# Patient Record
Sex: Male | Born: 1950 | Race: White | Hispanic: No | Marital: Married | Smoking: Former smoker
Health system: Southern US, Community
[De-identification: ages and names within clinical notes are randomized; demographics above are authoritative.]

## PROBLEM LIST (undated history)

## (undated) DIAGNOSIS — E785 Hyperlipidemia, unspecified: Secondary | ICD-10-CM

## (undated) DIAGNOSIS — E119 Type 2 diabetes mellitus without complications: Secondary | ICD-10-CM

## (undated) DIAGNOSIS — I10 Essential (primary) hypertension: Secondary | ICD-10-CM

## (undated) DIAGNOSIS — E669 Obesity, unspecified: Secondary | ICD-10-CM

## (undated) DIAGNOSIS — I2510 Atherosclerotic heart disease of native coronary artery without angina pectoris: Secondary | ICD-10-CM

## (undated) HISTORY — PX: OTHER SURGICAL HISTORY: SHX169

## (undated) HISTORY — PX: HAND SURGERY: SHX662

---

## 2012-12-28 ENCOUNTER — Encounter (HOSPITAL_COMMUNITY): Payer: Self-pay | Admitting: Physician Assistant

## 2012-12-28 ENCOUNTER — Inpatient Hospital Stay (HOSPITAL_COMMUNITY): Payer: Non-veteran care

## 2012-12-28 ENCOUNTER — Inpatient Hospital Stay (HOSPITAL_COMMUNITY)
Admission: EM | Admit: 2012-12-28 | Discharge: 2012-12-31 | DRG: 246 | Disposition: A | Discharge status: Home / Self Care | Payer: Non-veteran care | Source: Ambulatory Visit | Attending: Cardiovascular Disease | Admitting: Cardiovascular Disease

## 2012-12-28 ENCOUNTER — Encounter (HOSPITAL_COMMUNITY)
Admission: EM | Disposition: A | Discharge status: Home / Self Care | Payer: Self-pay | Source: Ambulatory Visit | Attending: Cardiovascular Disease

## 2012-12-28 DIAGNOSIS — E669 Obesity, unspecified: Secondary | ICD-10-CM

## 2012-12-28 DIAGNOSIS — E785 Hyperlipidemia, unspecified: Secondary | ICD-10-CM

## 2012-12-28 DIAGNOSIS — T46905A Adverse effect of unspecified agents primarily affecting the cardiovascular system, initial encounter: Secondary | ICD-10-CM | POA: Diagnosis present

## 2012-12-28 DIAGNOSIS — I2109 ST elevation (STEMI) myocardial infarction involving other coronary artery of anterior wall: Secondary | ICD-10-CM

## 2012-12-28 DIAGNOSIS — K7200 Acute and subacute hepatic failure without coma: Secondary | ICD-10-CM | POA: Diagnosis present

## 2012-12-28 DIAGNOSIS — I213 ST elevation (STEMI) myocardial infarction of unspecified site: Secondary | ICD-10-CM

## 2012-12-28 DIAGNOSIS — R0602 Shortness of breath: Secondary | ICD-10-CM | POA: Diagnosis present

## 2012-12-28 DIAGNOSIS — E876 Hypokalemia: Secondary | ICD-10-CM

## 2012-12-28 DIAGNOSIS — I10 Essential (primary) hypertension: Secondary | ICD-10-CM | POA: Diagnosis present

## 2012-12-28 DIAGNOSIS — D72829 Elevated white blood cell count, unspecified: Secondary | ICD-10-CM

## 2012-12-28 DIAGNOSIS — I2510 Atherosclerotic heart disease of native coronary artery without angina pectoris: Secondary | ICD-10-CM

## 2012-12-28 DIAGNOSIS — E119 Type 2 diabetes mellitus without complications: Secondary | ICD-10-CM

## 2012-12-28 DIAGNOSIS — I2119 ST elevation (STEMI) myocardial infarction involving other coronary artery of inferior wall: Principal | ICD-10-CM | POA: Diagnosis present

## 2012-12-28 DIAGNOSIS — R7989 Other specified abnormal findings of blood chemistry: Secondary | ICD-10-CM

## 2012-12-28 DIAGNOSIS — D649 Anemia, unspecified: Secondary | ICD-10-CM

## 2012-12-28 HISTORY — DX: Obesity, unspecified: E66.9

## 2012-12-28 HISTORY — PX: LEFT HEART CATHETERIZATION WITH CORONARY ANGIOGRAM: SHX5451

## 2012-12-28 HISTORY — DX: Essential (primary) hypertension: I10

## 2012-12-28 HISTORY — DX: Hyperlipidemia, unspecified: E78.5

## 2012-12-28 HISTORY — DX: Type 2 diabetes mellitus without complications: E11.9

## 2012-12-28 HISTORY — PX: CORONARY ANGIOPLASTY WITH STENT PLACEMENT: SHX49

## 2012-12-28 HISTORY — DX: Atherosclerotic heart disease of native coronary artery without angina pectoris: I25.10

## 2012-12-28 LAB — COMPREHENSIVE METABOLIC PANEL
ALT: 40 U/L (ref 0–53)
Alkaline Phosphatase: 82 U/L (ref 39–117)
BUN: 21 mg/dL (ref 6–23)
CO2: 21 mEq/L (ref 19–32)
GFR calc Af Amer: 69 mL/min — ABNORMAL LOW (ref >90)
GFR calc non Af Amer: 59 mL/min — ABNORMAL LOW (ref >90)
Glucose, Bld: 201 mg/dL — ABNORMAL HIGH (ref 70–99)
Potassium: 3.4 mEq/L — ABNORMAL LOW (ref 3.5–5.1)
Sodium: 135 mEq/L (ref 135–145)
Total Bilirubin: 0.2 mg/dL — ABNORMAL LOW (ref 0.3–1.2)

## 2012-12-28 LAB — POCT I-STAT, CHEM 8
BUN: 29 mg/dL — ABNORMAL HIGH (ref 6–23)
Calcium, Ion: 1.1 mmol/L — ABNORMAL LOW (ref 1.13–1.30)
HCT: 44 % (ref 39.0–52.0)
Hemoglobin: 15 g/dL (ref 13.0–17.0)
Potassium: 4.2 mEq/L (ref 3.5–5.1)
TCO2: 26 mmol/L (ref 0–100)

## 2012-12-28 LAB — LIPID PANEL
LDL Cholesterol: 102 mg/dL — ABNORMAL HIGH (ref 0–99)
Total CHOL/HDL Ratio: 6.7 RATIO
Triglycerides: 207 mg/dL — ABNORMAL HIGH (ref <150)
VLDL: 41 mg/dL — ABNORMAL HIGH (ref 0–40)

## 2012-12-28 LAB — TROPONIN I
Troponin I: >20 ng/mL (ref <0.30)
Troponin I: >20 ng/mL (ref <0.30)

## 2012-12-28 LAB — APTT: aPTT: 123 seconds — ABNORMAL HIGH (ref 24–37)

## 2012-12-28 LAB — PROTIME-INR
INR: 6.24 (ref 0.00–1.49)
Prothrombin Time: 52.6 seconds — ABNORMAL HIGH (ref 11.6–15.2)

## 2012-12-28 LAB — CBC
Hemoglobin: 12.7 g/dL — ABNORMAL LOW (ref 13.0–17.0)
MCH: 31.3 pg (ref 26.0–34.0)
MCHC: 34 g/dL (ref 30.0–36.0)
MCV: 92.1 fL (ref 78.0–100.0)
RBC: 4.06 MIL/uL — ABNORMAL LOW (ref 4.22–5.81)
RDW: 13.5 % (ref 11.5–15.5)

## 2012-12-28 LAB — CK TOTAL AND CKMB (NOT AT ARMC)
CK, MB: 76.1 ng/mL (ref 0.3–4.0)
Total CK: 807 U/L — ABNORMAL HIGH (ref 7–232)

## 2012-12-28 LAB — PRO B NATRIURETIC PEPTIDE: Pro B Natriuretic peptide (BNP): 160.3 pg/mL — ABNORMAL HIGH (ref 0–125)

## 2012-12-28 LAB — MRSA PCR SCREENING: MRSA by PCR: NEGATIVE

## 2012-12-28 SURGERY — LEFT HEART CATHETERIZATION WITH CORONARY ANGIOGRAM
Anesthesia: LOCAL

## 2012-12-28 MED ORDER — HEPARIN (PORCINE) IN NACL 2-0.9 UNIT/ML-% IJ SOLN
INTRAMUSCULAR | Status: AC
Start: 2012-12-28 — End: 2012-12-28
  Filled 2012-12-28: qty 1000

## 2012-12-28 MED ORDER — FENTANYL CITRATE 0.05 MG/ML IJ SOLN
INTRAMUSCULAR | Status: AC
  Filled 2012-12-28: qty 2

## 2012-12-28 MED ORDER — ACETAMINOPHEN 325 MG PO TABS: 650.0000 mg | ORAL_TABLET | ORAL | Status: DC | PRN

## 2012-12-28 MED ORDER — ZOLPIDEM TARTRATE 5 MG PO TABS: 5.0000 mg | ORAL_TABLET | Freq: Every evening | ORAL | Status: DC | PRN

## 2012-12-28 MED ORDER — LIDOCAINE HCL (PF) 1 % IJ SOLN
INTRAMUSCULAR | Status: AC
  Filled 2012-12-28: qty 30

## 2012-12-28 MED ORDER — TICAGRELOR 90 MG PO TABS
90.0000 mg | ORAL_TABLET | Freq: Two times a day (BID) | ORAL | Status: DC
  Administered 2012-12-28 – 2012-12-29 (×3): 90 mg via ORAL
  Filled 2012-12-28 (×6): qty 1

## 2012-12-28 MED ORDER — ALPRAZOLAM 0.25 MG PO TABS
0.2500 mg | ORAL_TABLET | Freq: Two times a day (BID) | ORAL | Status: DC | PRN
  Administered 2012-12-29 – 2012-12-30 (×2): 0.25 mg via ORAL
  Filled 2012-12-28 (×2): qty 1

## 2012-12-28 MED ORDER — ATORVASTATIN CALCIUM 80 MG PO TABS
80.0000 mg | ORAL_TABLET | Freq: Every day | ORAL | Status: DC
  Administered 2012-12-28 – 2012-12-30 (×3): 80 mg via ORAL
  Filled 2012-12-28 (×4): qty 1

## 2012-12-28 MED ORDER — SODIUM CHLORIDE 0.9 % IV SOLN: 250.0000 mL | INTRAVENOUS | Status: DC | PRN

## 2012-12-28 MED ORDER — SODIUM CHLORIDE 0.9 % IV SOLN
0.2500 mg/kg/h | INTRAVENOUS | Status: DC
  Filled 2012-12-28: qty 250

## 2012-12-28 MED ORDER — OXYCODONE-ACETAMINOPHEN 5-325 MG PO TABS
1.0000 | ORAL_TABLET | ORAL | Status: DC | PRN
Start: 2012-12-28 — End: 2012-12-29
  Administered 2012-12-29: 2 via ORAL
  Filled 2012-12-28: qty 2
  Filled 2012-12-28: qty 1
  Filled 2012-12-28: qty 2

## 2012-12-28 MED ORDER — ONDANSETRON HCL 4 MG/2ML IJ SOLN
4.0000 mg | Freq: Four times a day (QID) | INTRAMUSCULAR | Status: DC | PRN
  Administered 2012-12-28 – 2012-12-30 (×3): 4 mg via INTRAVENOUS
  Filled 2012-12-28 (×3): qty 2

## 2012-12-28 MED ORDER — SODIUM CHLORIDE 0.9 % IV SOLN
1.7500 mg/kg/h | INTRAVENOUS | Status: DC
  Filled 2012-12-28 (×2): qty 250

## 2012-12-28 MED ORDER — SODIUM CHLORIDE 0.9 % IV SOLN
INTRAVENOUS | Status: AC
  Administered 2012-12-28: 12:00:00 via INTRAVENOUS

## 2012-12-28 MED ORDER — SODIUM CHLORIDE 0.9 % IJ SOLN: 3.0000 mL | INTRAMUSCULAR | Status: DC | PRN

## 2012-12-28 MED ORDER — BIVALIRUDIN 250 MG IV SOLR
INTRAVENOUS | Status: AC
  Filled 2012-12-28: qty 250

## 2012-12-28 MED ORDER — ONDANSETRON HCL 4 MG/2ML IJ SOLN: 4.0000 mg | Freq: Four times a day (QID) | INTRAMUSCULAR | Status: DC | PRN

## 2012-12-28 MED ORDER — METOPROLOL TARTRATE 12.5 MG HALF TABLET
12.5000 mg | ORAL_TABLET | Freq: Two times a day (BID) | ORAL | Status: DC
  Administered 2012-12-28 – 2012-12-29 (×4): 12.5 mg via ORAL
  Filled 2012-12-28 (×6): qty 1

## 2012-12-28 MED ORDER — NITROGLYCERIN 0.4 MG SL SUBL: 0.4000 mg | SUBLINGUAL_TABLET | SUBLINGUAL | Status: DC | PRN

## 2012-12-28 MED ORDER — OXYMETAZOLINE HCL 0.05 % NA SOLN
2.0000 | NASAL | Status: DC | PRN
  Administered 2012-12-28: 2 via NASAL
  Filled 2012-12-28: qty 15

## 2012-12-28 MED ORDER — INFLUENZA VAC SPLIT QUAD 0.5 ML IM SUSP: 0.5000 mL | INTRAMUSCULAR | Status: DC | PRN

## 2012-12-28 MED ORDER — SODIUM CHLORIDE 0.9 % IJ SOLN
3.0000 mL | Freq: Two times a day (BID) | INTRAMUSCULAR | Status: DC
  Administered 2012-12-28 – 2012-12-30 (×4): 3 mL via INTRAVENOUS

## 2012-12-28 MED ORDER — TICAGRELOR 90 MG PO TABS
ORAL_TABLET | ORAL | Status: AC
  Filled 2012-12-28: qty 1

## 2012-12-28 MED ORDER — ATORVASTATIN CALCIUM 80 MG PO TABS: 80.0000 mg | ORAL_TABLET | Freq: Every day | ORAL | Status: DC

## 2012-12-28 MED ORDER — ASPIRIN 81 MG PO CHEW
81.0000 mg | CHEWABLE_TABLET | Freq: Every day | ORAL | Status: DC
  Administered 2012-12-29 – 2012-12-31 (×3): 81 mg via ORAL
  Filled 2012-12-28 (×3): qty 1

## 2012-12-28 MED ORDER — MIDAZOLAM HCL 2 MG/2ML IJ SOLN
INTRAMUSCULAR | Status: AC
  Filled 2012-12-28: qty 2

## 2012-12-28 MED ORDER — ACETAMINOPHEN 325 MG PO TABS
650.0000 mg | ORAL_TABLET | ORAL | Status: DC | PRN
  Administered 2012-12-31: 650 mg via ORAL
  Filled 2012-12-28: qty 2

## 2012-12-28 MED ORDER — MORPHINE SULFATE 2 MG/ML IJ SOLN
2.0000 mg | INTRAMUSCULAR | Status: DC | PRN
  Administered 2012-12-28 – 2012-12-29 (×4): 2 mg via INTRAVENOUS
  Filled 2012-12-28 (×4): qty 1

## 2012-12-28 MED ORDER — ENOXAPARIN SODIUM 40 MG/0.4ML ~~LOC~~ SOLN
40.0000 mg | SUBCUTANEOUS | Status: DC
  Administered 2012-12-28 – 2012-12-30 (×3): 40 mg via SUBCUTANEOUS
  Filled 2012-12-28 (×4): qty 0.4

## 2012-12-28 NOTE — CV Procedure (Signed)
Cardiac Catheterization Operative Report  Edward Fleming [id number] 12/3/201410:53 AM Pcp Not In System  Procedure Performed:  1. Left Heart Catheterization 2. Selective Coronary Angiography 3. Left ventricular angiogram 4. PTCA/DES x 1 mid Circumflex 5. PTCA/DES x 2 mid LAD 6. Angioseal femoral closure device right femoral artery  Operator: Verne Carrow, MD  Indication:  62 yo male with history of  HTN, former tobacco abuse admitted via EMS with acute anterolateral MI. Code STEMI activated by EMS.                                  Procedure Details: Emergency consent obtained. No pre-cath creatinine due to emergent nature of procedure with acute MI. Pt hypotensive with 3/10 chest pain upon arrival. The risks, benefits, complications, treatment options, and expected outcomes were discussed with the patient. The patient agree with plan for emergent cath. There was not a palpable radial artery pulse. The patient was sedated with Versed. The right groin was prepped and draped in the usual manner. Using the modified Seldinger access technique, a 6 French sheath was placed in the right femoral artery. Standard diagnostic catheters were used to perform selective coronary angiography. He was found to have acute occlusion of the mid Circumflex artery as well as occlusion of the mid LAD which both were felt to be involved in his presentation. We proceeded to PCI.   PCI Note: The patient was given a weight based bolus of Angiomax and a drip was started. He was given Brilinta 180 mg po x 1. I then engaged the left main with a XB 3.0 guiding catheter.   Lesion #1: When the ACT was over 200, I passed a Cougar IC wire down the Circumflex beyond the total occlusion. A 2.5 x 15 mm balloon was used to pre-dilate the occluded vessel in the mid segment x 2. Flow was restored down the Circumflex. I then deployed a 3.0 x 28 mm Xience Alpine DES in the mid vessel. The stent was post-dilated with a  3.25 x 20 mm [place] balloon x 2. The stenosis was taken from 100% down to 0%. I then turned my attention to the LAD which was sub-totally occluded with haziness throughout the mid vessel and TIMI-1 flow into the distal vessel. He had ongoing chest pain so this vessel was treated as a possible culprit as well.   Lesion #2: The Cougar IC wire was removed from the Circumflex and advanced down the LAD. I then used a 2.0 x 20 mm balloon to pre-dilate the stenosis in the mid LAD x 6. I then deployed a 2.5 x 38 mm Xience Alpine DES in the mid LAD. A 2.75 x 23 mm Xience Alpine DES was deployed in an overlapping fashion with the proximal edge of the first stent. The distal stent was post-dilated with a 2.5 x 20 mm [place] balloon x 2. The mid stented segment was post-dilated with a 3.0 x 20 mm [place] balloon x 2. I then post-dilated the most proximal stented segment with a 3.5 x 8 mm [place] balloon x 1. The stenosis was taken from 100% down to 0%.   The guiding catheter was removed. A pigtail catheter was used to perform a left ventricular angiogram. An Angioseal was deployed in the right femoral artery.   There were no immediate complications. The patient was taken to the recovery area in stable condition.   Hemodynamic Findings: Central  aortic pressure: 91/67 Left ventricular pressure: 94/20/27  Angiographic Findings:  Left main: No obstructive disease.   Left Anterior Descending Artery: Large caliber vessel that courses to the apex. The proximal vessel has diffuse 20% stenosis. The mid vessel has 99% sub-total occlusion with haziness throughout the occluded segment and TIMI-1 flow into the distal vessel. There are three small caliber diagonal branches.   Circumflex Artery: Large caliber vessel with two obtuse marginal branches. The first OM branch is a small caliber vessel with proximal 90% stenosis. The mid vessel has 100% occlusion, haziness c/w thrombus. After the vessel was opened, there is a large obtuse marginal  branch.   Right Coronary Artery: Large dominant vessel with serial 30% stenoses in the proximal, mid and distal vessel.   Left Ventricular Angiogram: LVEF=55% with apical hypokinesis.   Impression: 1. Acute anterolateral STEMI with occluded Circumflex, sub-totally occluded mid LAD (both vessels are felt to be possible culprit vessels) 2. Severe double vessel CAD. 3. Preserved LV systolic function with hypokinesis of the apex 4. Successful PTCA/DES x 1 mid Circumflex 5. Successful PTCA/DES x 2 mid LAD  Recommendations: Admit to CCU. He will need one year of ASA and Brilinta. Will start beta blocker and statin. Will start Ace-inh tomorrow if BP is stable. Echo tomorrow.        Complications:  None. The patient tolerated the procedure well.

## 2012-12-28 NOTE — Progress Notes (Signed)
Critical value called from lab  Troponin 4.82 CK MB 76.1 INR - 6.24  Dr Clifton James notified of lab work.  No new orders.  Values consistent with STEMI and subsequent cath.    Eliane Decree, RN

## 2012-12-28 NOTE — Care Management Note (Signed)
    Page 1 of 1   12/28/2012     1:26:21 PM   CARE MANAGEMENT NOTE 12/28/2012  Patient:  Edward Fleming, Edward Fleming   Account Number:  [id number]  Date Initiated:  12/28/2012  Documentation initiated by:  Junius Creamer  Subjective/Objective Assessment:   adm w mi     Action/Plan:   lives w wife   Anticipated DC Date:     Anticipated DC Plan:        DC Planning Services  CM consult  Medication Assistance      Choice offered to / List presented to:             Status of service:   Medicare Important Message given?   (If response is "NO", the following Medicare IM given date fields will be blank) Date Medicare IM given:   Date Additional Medicare IM given:    Discharge Disposition:    Per UR Regulation:  Reviewed for med. necessity/level of care/duration of stay  If discussed at Long Length of Stay Meetings, dates discussed:    Comments:  12/3 1325 debbie Lawren Sexson rn,bsn gave pt 30day free and copay assist card for brilinta.

## 2012-12-28 NOTE — H&P (Signed)
History and Physical   Patient ID: Edward Fleming MRN: [id number], DOB/AGE: 1950-07-31 62 y.o. Date of Encounter: 12/28/2012  [facility]: [facility] Primary Cardiologist: New  Chief Complaint:  Inferior STEMI  HPI: Edward Fleming is a 62 y.o. male with no history of CAD. He was awakened at 5:30 AM today by substernal chest pain at an 8/10. He had diaphoresis but no nausea, vomiting or shortness of breath. When his symptoms did not resolve, he went to an urgent care. His ECG was consistent with an inferior STEMI. EMS was called and he was transferred emergently to [facility] and taken directly to the cath lab.  In route, he was given sublingual nitroglycerin x1 and ASA 81 mg x4. Upon arrival to the cath lab, his pain was a 3/10, and his ECG was still abnormal.  He has never had this type of pain before. He is generally active at work and around the house without any difficulty.   Past Medical History  Diagnosis Date  . Hypertension     Surgical History:  Past Surgical History  Procedure Laterality Date  . Hand surgery Left      I have reviewed the patient's current medications. Prior to Admission medications   1. Fluid medicine 2. Glucosamine 3. Lisinopril    Allergies: NKDA  History   Social History  . Marital Status:  married     Spouse Name: N/A    Number of Children: N/A  . Years of Education: N/A   Occupational History  . Installs telephone systems    Social History Main Topics  . Smoking status: Former Smoker -- 1.00 packs/day for 40 years    Quit date: 09/28/2012  . Smokeless tobacco: Never Used  . Alcohol Use: No     Comment: No abuse  . Drug Use: No  . Sexual Activity: Not on file   Other Topics Concern  . Not on file   Social History Narrative   Lives with wife.    Family History  Problem Relation Age of Onset  . Heart attack Brother    Family Status  Relation Status Death Age  . Brother Alive     History of CAD    Review of  Systems:   Full 14-point review of systems otherwise negative except as noted above.  Physical Exam: General: Well developed, well nourished,male in acute distress. Head: Normocephalic, atraumatic, sclera non-icteric, no xanthomas, nares are without discharge. Dentition: Good Neck: No carotid bruits. JVD not elevated. No thyromegally Lungs: Good expansion bilaterally. without wheezes or rhonchi.  Heart: Regular rate and rhythm with S1 S2.  No S3 or S4.  No murmur, no rubs, or gallops appreciated. Abdomen: Soft, non-tender, non-distended with normoactive bowel sounds. No hepatomegaly. No rebound/guarding. No obvious abdominal masses. Msk:  Strength and tone appear normal for age. No joint deformities or effusions, no spine or costo-vertebral angle tenderness. Extremities: No clubbing or cyanosis. No edema.  Distal pedal pulses are 2+ in 4 extrem Neuro: Alert and oriented X 3. Moves all extremities spontaneously. No focal deficits noted. Psych:  Responds to questions appropriately with a normal affect. Skin: No rashes or lesions noted  Labs: Pending  Radiology/Studies: Pending   ECG: Sinus rhythm, inferior ST elevation  ASSESSMENT AND PLAN:  Principal Problem:   ST elevation myocardial infarction (STEMI) of inferior wall, initial episode of care Active Problems:   Hypertension  Edward Fleming was taken directly to the cath lab with further evaluation and treatment  depending on the results. He will be continued on his home medications once they are confirmed by pharmacy. He will be screened for cardiac risk factors. He will be started on a statin and a beta blocker (as his blood pressure will permit) and continued on his ACE inhibitor.  SignedTheodore Demark, PA-C 12/28/2012 10:09 AM Beeper [phone]   I have personally seen and examined this patient with Theodore Demark, PA-C. I agree with the assessment and plan as outlined above. Emergent cath for STEMI. Further plans to follow.    Edward Fleming 12:54 PM 12/28/2012

## 2012-12-29 ENCOUNTER — Encounter (HOSPITAL_COMMUNITY): Payer: Self-pay | Admitting: Neurology

## 2012-12-29 ENCOUNTER — Inpatient Hospital Stay (HOSPITAL_COMMUNITY): Payer: Non-veteran care

## 2012-12-29 DIAGNOSIS — I219 Acute myocardial infarction, unspecified: Secondary | ICD-10-CM

## 2012-12-29 DIAGNOSIS — I2119 ST elevation (STEMI) myocardial infarction involving other coronary artery of inferior wall: Principal | ICD-10-CM

## 2012-12-29 LAB — CBC
Hemoglobin: 12.4 g/dL — ABNORMAL LOW (ref 13.0–17.0)
MCH: 31.1 pg (ref 26.0–34.0)
MCHC: 33.4 g/dL (ref 30.0–36.0)
MCV: 93 fL (ref 78.0–100.0)
Platelets: 237 10*3/uL (ref 150–400)
RDW: 13.9 % (ref 11.5–15.5)
WBC: 13 10*3/uL — ABNORMAL HIGH (ref 4.0–10.5)

## 2012-12-29 LAB — LIPID PANEL
Cholesterol: 178 mg/dL (ref 0–200)
HDL: 28 mg/dL — ABNORMAL LOW (ref >39)
Total CHOL/HDL Ratio: 6.4 RATIO

## 2012-12-29 LAB — COMPREHENSIVE METABOLIC PANEL
ALT: 100 U/L — ABNORMAL HIGH (ref 0–53)
Alkaline Phosphatase: 84 U/L (ref 39–117)
Calcium: 9 mg/dL (ref 8.4–10.5)
Chloride: 99 mEq/L (ref 96–112)
GFR calc Af Amer: >90 mL/min (ref >90)
Glucose, Bld: 133 mg/dL — ABNORMAL HIGH (ref 70–99)
Potassium: 3.7 mEq/L (ref 3.5–5.1)
Sodium: 137 mEq/L (ref 135–145)
Total Protein: 7.4 g/dL (ref 6.0–8.3)

## 2012-12-29 MED ORDER — OXYCODONE-ACETAMINOPHEN 5-325 MG PO TABS
1.0000 | ORAL_TABLET | ORAL | Status: DC | PRN
  Administered 2012-12-29: 2 via ORAL
  Administered 2012-12-30: 1 via ORAL
  Filled 2012-12-29: qty 1

## 2012-12-29 MED ORDER — PERFLUTREN LIPID MICROSPHERE
INTRAVENOUS | Status: AC
  Filled 2012-12-29: qty 10

## 2012-12-29 MED ORDER — PERFLUTREN LIPID MICROSPHERE
1.0000 mL | INTRAVENOUS | Status: AC | PRN
  Administered 2012-12-29: 1 mL via INTRAVENOUS

## 2012-12-29 MED ORDER — FUROSEMIDE 10 MG/ML IJ SOLN
INTRAMUSCULAR | Status: AC
  Filled 2012-12-29: qty 4

## 2012-12-29 MED ORDER — FUROSEMIDE 10 MG/ML IJ SOLN
20.0000 mg | Freq: Once | INTRAMUSCULAR | Status: AC
  Administered 2012-12-29: 20 mg via INTRAVENOUS

## 2012-12-29 NOTE — Progress Notes (Signed)
Subjective:  No further chest pain. Some dyspnea, but better after lasix. Nausea improved after zofran.  Objective:  Vital Signs in the last 24 hours: Temp:  [97.5 F (36.4 C)-99.3 F (37.4 C)] 98.1 F (36.7 C) (12/04 1200) Pulse Rate:  [71-107] 95 (12/04 0900) Resp:  [20-26] 20 (12/03 2000) BP: (78-133)/(44-96) 99/44 mmHg (12/04 1311) SpO2:  [85 %-100 %] 97 % (12/04 1311) Weight:  [182 lb 15.7 oz (83 kg)] 182 lb 15.7 oz (83 kg) (12/04 0800)  Intake/Output from previous day: 12/03 0701 - 12/04 0700 In: 712.5 [P.O.:210; I.V.:502.5] Out: 1925 [Urine:1925]  Physical Exam: Pt is alert and oriented, NAD HEENT: normal Neck: JVP - normal, carotids 2+= without bruits Lungs: CTA bilaterally CV: RRR without murmur or gallop Abd: soft, NT, Positive BS, obese Ext: no C/C/E, distal pulses intact and equal Skin: warm/dry no rash   Lab Results:  Recent Labs  12/28/12 0950 12/29/12 0405  WBC 11.3* 13.0*  HGB 12.7* 12.4*  PLT 259 237    Recent Labs  12/28/12 0950 12/29/12 0405  NA 135 137  K 3.4* 3.7  CL 97 99  CO2 21 23  GLUCOSE 201* 133*  BUN 21 17  CREATININE 1.27 0.90    Recent Labs  12/28/12 1847 12/28/12 2340  TROPONINI >20.00* >20.00*    Cardiac Studies: 2D Echo: Left ventricle: The cavity size was normal. Wall thickness was normal. Systolic function was vigorous. The estimated ejection fraction was in the range of 65% to 70%. Wall motion was normal; there were no regional wall motion abnormalities. The transmitral flow pattern was normal. The deceleration time of the early transmitral flow velocity was normal. The pulmonary vein flow pattern was normal. The tissue Doppler parameters were normal. Left ventricular diastolic function parameters were normal.  ------------------------------------------------------------ Aortic valve: Structurally normal valve. Trileaflet. Cusp separation was normal. Doppler: Transvalvular velocity was within the  normal range. There was no stenosis. No significant regurgitation.  ------------------------------------------------------------ Aorta: The aorta was normal, not dilated, and non-diseased.  ------------------------------------------------------------ Mitral valve: Structurally normal valve. Leaflet separation was normal. Doppler: Transvalvular velocity was within the normal range. There was no evidence for stenosis. No significant regurgitation. Peak gradient: 4mm Hg (D).  ------------------------------------------------------------ Left atrium: The atrium was normal in size.  ------------------------------------------------------------ Atrial septum: No defect or patent foramen ovale was identified.  ------------------------------------------------------------ Right ventricle: The cavity size was normal. Wall thickness was normal. Systolic function was normal. Systolic pressure was within the normal range.  ------------------------------------------------------------ Pulmonic valve: Structurally normal valve. Cusp separation was normal. Doppler: Transvalvular velocity was within the normal range. No regurgitation.  ------------------------------------------------------------ Tricuspid valve: Structurally normal valve. Leaflet separation was normal. Doppler: Transvalvular velocity was within the normal range. No significant regurgitation.  ------------------------------------------------------------ Pulmonary artery: The main pulmonary artery was normal-sized.  ------------------------------------------------------------ Right atrium: The atrium was normal in size.  ------------------------------------------------------------ Pericardium: The pericardium was normal in appearance.  ------------------------------------------------------------ Systemic veins: Inferior vena cava: The vessel was normal in size; the respirophasic diameter changes were in the normal range  (= 50%); findings are consistent with normal central venous pressure.  Tele: Sinus rhythm with single PVC's. Nonsustained VT overnight, but none today.  Assessment/Plan:  1. Acute inferior wall STEMI - s/p PCI of the LCx and LAD. Troponin > 20 but LVEF preserved.  2. Elevated LFT's - consistent with acute MI. Continue statin and repeat 24-48 hours.  3. Hyperlipidemia - high-intensity statin  Meds/data reviewed. Continue ASA, brilinta, atorvastatin, metoprolol. BP will not allow for  ACE-I. Watch in ICU until tomorrow as patient a little slow to progress.Not sure if shortness of breath related to edema or side effect of brilinta. Will continue to monitor. Good prognosis with preserved LV function. Anticipate transfer tele in am and home next day.  Tonny Bollman, M.D. 12/29/2012, 2:03 PM

## 2012-12-29 NOTE — Progress Notes (Signed)
Dr.Herman notified of increase work of breathing with crackles in the bases. Respirations 20's with sats at 97 % yet patient is notably Short of breath. Will give lasix as ordered and obtain CXR

## 2012-12-29 NOTE — Progress Notes (Signed)
  Echocardiogram 2D Echocardiogram (with Definity) has been performed.  Genesi Stefanko 12/29/2012, 9:52 AM

## 2012-12-29 NOTE — Progress Notes (Signed)
CARDIAC REHAB PHASE I   PRE:  Rate/Rhythm: 87 SR    BP: sitting 103/73    SaO2:   MODE:  Ambulation: 350 ft   POST:  Rate/Rhythm: 105 ST with PVC    BP: sitting 105/71     SaO2: 96 RA  Tolerated fairly well but more SOB after walk. Sts his back is hurting (chronic). Began ed with wife and daughter present. Made pt aware of his Hgb A1C. Left diet sheets for pt to begin reading. Pt interested in CRPII and will send referral to [place] [facility]. Had to cut ed short due to severe back pain.  [phone]  Elissa Lovett [place] CES, ACSM 12/29/2012 11:25 AM

## 2012-12-29 NOTE — Progress Notes (Signed)
Inpatient Diabetes Program Recommendations  AACE/ADA: New Consensus Statement on Inpatient Glycemic Control (2013)  Target Ranges:  Prepandial:   less than 140 mg/dL      Peak postprandial:   less than 180 mg/dL (1-2 hours)      Critically ill patients:  140 - 180 mg/dL     Results for KIER, SMEAD (MRN [id number]) as of 12/29/2012 09:28  Ref. Range 12/28/2012 09:50  Hemoglobin A1C Latest Range: <5.7 % 6.8 (H)    **Admitted with STEMI.  A1c 6.8% (12/28/12).  Did not see that patient has a prior history of diabetes.  **Per ADA Standards of Care, A1c of 6.5% or greater is indicative of positive diagnosis of DM.  Do not see that the physician has addressed this yet with the patient.    **MD- Please address elevated A1c with patient asap.  Patient will need DM education initiated before discharge if you deem this as a positive diagnosis.  Thanks!   Will follow. Ambrose Finland RN, MSN, CDE Diabetes Coordinator Inpatient Diabetes Program Team Pager: [phone] (8a-10p)

## 2012-12-30 ENCOUNTER — Other Ambulatory Visit: Payer: Self-pay

## 2012-12-30 LAB — HEPATIC FUNCTION PANEL
ALT: 77 U/L — ABNORMAL HIGH (ref 0–53)
AST: 177 U/L — ABNORMAL HIGH (ref 0–37)
Alkaline Phosphatase: 80 U/L (ref 39–117)
Bilirubin, Direct: 0.1 mg/dL (ref 0.0–0.3)
Total Bilirubin: 0.8 mg/dL (ref 0.3–1.2)

## 2012-12-30 MED ORDER — FUROSEMIDE 10 MG/ML IJ SOLN
20.0000 mg | Freq: Once | INTRAMUSCULAR | Status: AC
  Administered 2012-12-30: 20 mg via INTRAVENOUS

## 2012-12-30 MED ORDER — PRASUGREL HCL 10 MG PO TABS
30.0000 mg | ORAL_TABLET | Freq: Every day | ORAL | Status: DC
  Administered 2012-12-30: 30 mg via ORAL
  Filled 2012-12-30 (×2): qty 3

## 2012-12-30 MED ORDER — METOPROLOL TARTRATE 25 MG PO TABS
25.0000 mg | ORAL_TABLET | Freq: Two times a day (BID) | ORAL | Status: DC
  Administered 2012-12-30 – 2012-12-31 (×2): 25 mg via ORAL
  Filled 2012-12-30 (×4): qty 1

## 2012-12-30 MED ORDER — PRASUGREL HCL 10 MG PO TABS
10.0000 mg | ORAL_TABLET | Freq: Every day | ORAL | Status: DC
  Administered 2012-12-31: 10 mg via ORAL
  Filled 2012-12-30: qty 1

## 2012-12-30 NOTE — Progress Notes (Signed)
Pt has been walking without problems. Walked from [facility] to 2W. Feels good. Ed completed with wife present. Voiced understanding.  [phone] Ethelda Chick CES, ACSM 3:13 PM 12/30/2012

## 2012-12-30 NOTE — Progress Notes (Signed)
Called to bedside by Nurse for increase SOB . Pt s/p inferior STEMI yesterday .appears mild SOB, bibasilar insp crackles, no JVD, no murmurs on exam  Appreciated, no edma, Vitals stable. 93 % on RA. EKG shows resolution of ST elevations. Apparently pt had similar episode last night that improved with lasix. LVEDP at the time of cath was 20-27. Echo and chest xray this am was unremarkable.  Ass  Possible volume overload related to diastolic dysfunction s/p MI . No evidence of ongoing ischemia or mechanical complications s/p MI . Other possibility is adverse effect of Ticagrelor. Will give lasix 20 mg IV . Consider switching to Prasugrel in am per primary cardiologist. Continue to monitor   Donnella Sham, M.D

## 2012-12-30 NOTE — Progress Notes (Signed)
    Subjective:  Still with episodic shortness of breath. Notes overnight reviewed. No chest pain.  Objective:  Vital Signs in the last 24 hours: Temp:  [97.9 F (36.6 C)-98.7 F (37.1 C)] 98.7 F (37.1 C) (12/04 2353) Pulse Rate:  [94-99] 94 (12/04 2353) Resp:  [16-20] 18 (12/05 1610) BP: (99-117)/(44-78) 106/64 mmHg (12/05 0638) SpO2:  [93 %-100 %] 98 % (12/05 9604) Weight:  [182 lb 8 oz (82.781 kg)] 182 lb 8 oz (82.781 kg) (12/05 0500)  Intake/Output from previous day: 12/04 0701 - 12/05 0700 In: 1083 [P.O.:1080; I.V.:3] Out: 1550 [Urine:1550]  Physical Exam: Pt is alert and oriented, NAD HEENT: normal Neck: JVP - normal, carotids 2+= without bruits Lungs: CTA bilaterally CV: tachy and regular without murmur or gallop Abd: soft, NT, Positive BS, no hepatomegaly Ext: no C/C/E, distal pulses intact and equal Skin: warm/dry no rash   Lab Results:  Recent Labs  12/28/12 0950 12/29/12 0405  WBC 11.3* 13.0*  HGB 12.7* 12.4*  PLT 259 237    Recent Labs  12/28/12 0950 12/29/12 0405  NA 135 137  K 3.4* 3.7  CL 97 99  CO2 21 23  GLUCOSE 201* 133*  BUN 21 17  CREATININE 1.27 0.90    Recent Labs  12/29/12 1350 12/30/12 0510  TROPONINI >20.00* >20.00*    Tele: Sinus rhythm, sinus tach  Assessment/Plan:  1. Acute inferior MI 2. Increased LFT's - secondary to #1 not true liver disease, trending down. 3. Hyperlipidemia - continue high-intensity statin drug  Overall doing well. I think he is short of breath from brilinta. Will transition to effient today with 30 mg loading dose then 10 mg daily starting tomorrow. Increase metoprolol. Tx tele today. If stable tomorrow should be ready for discharge.  Tonny Bollman, M.D. 12/30/2012, 8:24 AM

## 2012-12-30 NOTE — Progress Notes (Signed)
Was called to pt's room at 2330 due to c/o increasing shortness of breath, chest pressure ("no pain") and difficulty breathing. Pt was exhibiting tachypnea, accessory muscle use, and labored breathing. Elevated HOB to 45 degrees, checked 02 saturation (93% on RA) and auscultated lungs. Fine crackles ausculatated in bilateral lower lung fields which was consistent with findings at initial shift assessment. Vital signs were normal. Applied N/C at 2Lpm, performed ECG, notified MD. MD came to bedside and validated assessment findings. ECG showed NSR with no changes from previous reading. Orders received to administer 20mg  IV Lasix. Lasix given at 0015.   At approximately 0130, pt states his WOB greatly improved and that he was able to lie down without any further respiratory difficulty. Pt also educated on respiratory side-effects of Brilinta as a potential cause for symptoms; pt verbalized understanding. Pt mentioned he had a similar respiratory reaction the night before and required 20 mg IV Lasix at that time too. MD aware. Will continue to monitor patient.

## 2012-12-31 ENCOUNTER — Encounter (HOSPITAL_COMMUNITY): Payer: Self-pay | Admitting: Physician Assistant

## 2012-12-31 DIAGNOSIS — E119 Type 2 diabetes mellitus without complications: Secondary | ICD-10-CM

## 2012-12-31 DIAGNOSIS — D649 Anemia, unspecified: Secondary | ICD-10-CM

## 2012-12-31 DIAGNOSIS — I2510 Atherosclerotic heart disease of native coronary artery without angina pectoris: Secondary | ICD-10-CM

## 2012-12-31 DIAGNOSIS — E876 Hypokalemia: Secondary | ICD-10-CM

## 2012-12-31 DIAGNOSIS — D72829 Elevated white blood cell count, unspecified: Secondary | ICD-10-CM

## 2012-12-31 DIAGNOSIS — I2109 ST elevation (STEMI) myocardial infarction involving other coronary artery of anterior wall: Secondary | ICD-10-CM

## 2012-12-31 DIAGNOSIS — E669 Obesity, unspecified: Secondary | ICD-10-CM

## 2012-12-31 DIAGNOSIS — E785 Hyperlipidemia, unspecified: Secondary | ICD-10-CM

## 2012-12-31 LAB — CBC
HCT: 38.2 % — ABNORMAL LOW (ref 39.0–52.0)
MCH: 31.4 pg (ref 26.0–34.0)
MCHC: 33.2 g/dL (ref 30.0–36.0)
MCV: 94.3 fL (ref 78.0–100.0)
Platelets: 241 10*3/uL (ref 150–400)
RBC: 4.05 MIL/uL — ABNORMAL LOW (ref 4.22–5.81)

## 2012-12-31 LAB — BASIC METABOLIC PANEL
BUN: 17 mg/dL (ref 6–23)
Calcium: 8.9 mg/dL (ref 8.4–10.5)
GFR calc non Af Amer: 77 mL/min — ABNORMAL LOW (ref >90)
Glucose, Bld: 157 mg/dL — ABNORMAL HIGH (ref 70–99)

## 2012-12-31 MED ORDER — METOPROLOL TARTRATE 25 MG PO TABS: 25.0000 mg | ORAL_TABLET | Freq: Two times a day (BID) | ORAL | Status: AC

## 2012-12-31 MED ORDER — PRASUGREL HCL 10 MG PO TABS: 10.0000 mg | ORAL_TABLET | Freq: Every day | ORAL | Status: DC

## 2012-12-31 MED ORDER — ATORVASTATIN CALCIUM 80 MG PO TABS: 80.0000 mg | ORAL_TABLET | Freq: Every day | ORAL | Status: DC

## 2012-12-31 MED ORDER — NITROGLYCERIN 0.4 MG SL SUBL: 0.4000 mg | SUBLINGUAL_TABLET | SUBLINGUAL | Status: AC | PRN

## 2012-12-31 MED ORDER — INFLUENZA VAC SPLIT QUAD 0.5 ML IM SUSP
0.5000 mL | INTRAMUSCULAR | Status: AC | PRN
  Administered 2012-12-31: 0.5 mL via INTRAMUSCULAR
  Filled 2012-12-31: qty 0.5

## 2012-12-31 MED ORDER — INFLUENZA VAC SPLIT QUAD 0.5 ML IM SUSP: 0.5000 mL | INTRAMUSCULAR | Status: DC

## 2012-12-31 NOTE — Progress Notes (Signed)
CARDIAC REHAB PHASE I   PRE:  Rate/Rhythm: 91 sinus  BP:  Supine:   Sitting: 102/60  Standing:    SaO2: 95%ra  MODE:  Ambulation: 550 ft   POST:  Rate/Rhythem: 93 sinus  BP:  Supine:   Sitting: 98/64  Standing:    SaO2: 98% ra [phone] Pt ambulated in hallway without difficulty, slow steady gait, denied chest pain or dyspnea.  Education reinforced, pt and wife questions answered about outpatient cardiac rehab services.  Pt will contact his PCP at [facility] for authorization to participate in [place] [facility].  Pt awaiting DC home  [place], [place]

## 2012-12-31 NOTE — Progress Notes (Signed)
Reviewed dc instructions with patient and wife by teachback, verbalized understanding . Dc home with instructions prescriptions, including eliquis card Edward Fleming A

## 2012-12-31 NOTE — Progress Notes (Signed)
Patient Name: Edward Fleming Date of Encounter: 12/31/2012     Principal Problem:   ST elevation myocardial infarction (STEMI) of inferior wall, initial episode of care Active Problems:   Hypertension   STEMI (ST elevation myocardial infarction)   Acute anterolateral wall MI    SUBJECTIVE  The patient feels well this morning.  He is anxious to go home.  Rhythm remains normal sinus rhythm.  CURRENT MEDS . aspirin  81 mg Oral Daily  . atorvastatin  80 mg Oral q1800  . enoxaparin (LOVENOX) injection  40 mg Subcutaneous Q24H  . metoprolol tartrate  25 mg Oral BID  . prasugrel  10 mg Oral Daily  . prasugrel  30 mg Oral Daily    OBJECTIVE  Filed Vitals:   12/30/12 0810 12/30/12 1337 12/30/12 2051 12/31/12 0415  BP: 94/69 101/71 96/61 106/73  Pulse:  82 92 91  Temp:  97.9 F (36.6 C) 98.5 F (36.9 C) 98.1 F (36.7 C)  TempSrc:  Oral Oral Oral  Resp:  20 20 19   Height:      Weight:    183 lb 3.2 oz (83.099 kg)  SpO2:  96% 98% 97%    Intake/Output Summary (Last 24 hours) at 12/31/12 0950 Last data filed at 12/31/12 0700  Gross per 24 hour  Intake    600 ml  Output    300 ml  Net    300 ml   Filed Weights   12/29/12 0800 12/30/12 0500 12/31/12 0415  Weight: 182 lb 15.7 oz (83 kg) 182 lb 8 oz (82.781 kg) 183 lb 3.2 oz (83.099 kg)    PHYSICAL EXAM  General: Pleasant, NAD. Neuro: Alert and oriented X 3. Moves all extremities spontaneously. Psych: Normal affect. HEENT:  Normal  Neck: Supple without bruits or JVD. Lungs:  Resp regular and unlabored, CTA. Heart: RRR no s3, s4, or murmurs. Abdomen: Soft, non-tender, non-distended, BS + x 4.  Extremities: No clubbing, cyanosis or edema. DP/PT/Radials 2+ and equal bilaterally.  Accessory Clinical Findings  CBC  Recent Labs  12/29/12 0405 12/31/12 0353  WBC 13.0* 11.4*  HGB 12.4* 12.7*  HCT 37.1* 38.2*  MCV 93.0 94.3  PLT 237 241   Basic Metabolic Panel  Recent Labs  12/29/12 0405 12/31/12 0353  NA  137 137  K 3.7 3.7  CL 99 98  CO2 23 29  GLUCOSE 133* 157*  BUN 17 17  CREATININE 0.90 1.02  CALCIUM 9.0 8.9   Liver Function Tests  Recent Labs  12/29/12 0405 12/30/12 0510  AST 352* 177*  ALT 100* 77*  ALKPHOS 84 80  BILITOT 0.6 0.8  PROT 7.4 7.4  ALBUMIN 3.8 3.7   No results found for this basename: LIPASE, AMYLASE,  in the last 72 hours Cardiac Enzymes  Recent Labs  12/28/12 2340 12/29/12 1350 12/30/12 0510  TROPONINI >20.00* >20.00* >20.00*   BNP No components found with this basename: POCBNP,  D-Dimer No results found for this basename: DDIMER,  in the last 72 hours Hemoglobin A1C No results found for this basename: HGBA1C,  in the last 72 hours Fasting Lipid Panel  Recent Labs  12/29/12 0405  CHOL 178  HDL 28*  LDLCALC 101*  TRIG 243*  CHOLHDL 6.4   Thyroid Function Tests  Recent Labs  12/28/12 1208  TSH 2.254    TELE  Normal sinus rhythm  ECG  30-Dec-2012 07:04:19 [facility] ROUTINE RECORD Normal sinus rhythm Low voltage QRS Cannot rule out  Inferior infarct , age undetermined Abnormal ECG No significant change since last tracing Confirmed by Gala Romney MD, DANIEL  Radiology/Studies  Dg Chest Port 1 View  12/29/2012   CLINICAL DATA:  CHF  EXAM: PORTABLE CHEST - 1 VIEW  COMPARISON:  12/28/2012  FINDINGS: The heart size and mediastinal contours are within normal limits. Both lungs are clear. The visualized skeletal structures are unremarkable.  IMPRESSION: No active disease.   Electronically Signed   By: Salome Holmes M.D.   On: 12/29/2012 07:52   Portable Chest X-ray 1 View  12/28/2012   CLINICAL DATA:  Post cardiac catheterization with stent placement. History of MI  EXAM: PORTABLE CHEST - 1 VIEW  COMPARISON:  None.  FINDINGS: The lungs are well-expanded and clear. The cardiopericardial silhouette is normal in size. The pulmonary vascularity is not engorged. The mediastinum is normal in width. There is mild  tortuosity of the descending thoracic aorta. There is no pleural effusion or pneumothorax. The observed portions of the bony thorax appear normal.  IMPRESSION: There is no evidence of pneumonia nor CHF nor other acute cardiopulmonary abnormality.   Electronically Signed   By: David  [place]   On: 12/28/2012 13:17    ASSESSMENT AND PLAN 1. Acute inferior MI  2. Increased LFT's - secondary to #1 not true liver disease, trending down.  3. Hyperlipidemia - continue high-intensity statin drug  Okay for discharge today on aspirin and Effient. Blood pressure still soft so no ACE inhibitor at this point and we will not titrate his beta blocker higher at this point.  Signed, Cassell Clement  MD

## 2012-12-31 NOTE — Discharge Summary (Signed)
Discharge Summary   Patient ID: Edward Fleming,  MRN: [id number], DOB/AGE: 61/22/1952 61 y.o.  Admit date: 12/28/2012 Discharge date: 12/31/2012  Primary Physician: Pcp Not In System Primary Cardiologist: New- to establish with C. Clifton James, MD   Discharge Diagnoses Principal Problem:   ST elevation myocardial infarction (STEMI) of anterolateral wall  - S/p DES x 1 mid LCx, DES x 2 mid LAD; EF 55%, apical HK Active Problems:   CAD, native coronary artery  - As above  - Discharged on low-dose ASA, Effient, metoprolol tartrate 25 BID, atorvastatin 80, NTG SL PRN   Hypertension  - BP a bit soft near discharge. ACEi held, Lopressor resumed.    Diabetes mellitus, new onset  - Hgb A1C 6.8%  - Diet & lifestyle modifications recommended x 3 months  - Follow-up with PCP at the [facility] for ongoing management   Elevated LFTs  - AST 352->177, ALT 100->77 from yesterday to today, felt to represent shock liver (hypotensive initially with STEMI) over primary hepatotoxicity  - Resume high potency statin and check LFTs in 1 week on follow-up   Obesity   Hypokalemia  - 3.4, repleted   Hyperlipidemia  - High potency statin   Leukocytosis  - Reactive from MI, down-trending near discharge   Normocytic anemia  - Hgb >12/Hct >37 through admission, follow-up PCP   Allergies Allergies  Allergen Reactions  . Ticagrelor     Possible cause of shortness of breath    Diagnostic Studies/Procedures  CARDIAC CATHETERIZATION + PERCUTANEOUS CORONARY INTERVENTION - 12/28/12  Hemodynamic Findings:  Central aortic pressure: 91/67  Left ventricular pressure: 94/20/27  Angiographic Findings:  Left main: No obstructive disease.  Left Anterior Descending Artery: Large caliber vessel that courses to the apex. The proximal vessel has diffuse 20% stenosis. The mid vessel has 99% sub-total occlusion with haziness throughout the occluded segment and TIMI-1 flow into the distal vessel. There are three small caliber  diagonal branches.  Circumflex Artery: Large caliber vessel with two obtuse marginal branches. The first OM branch is a small caliber vessel with proximal 90% stenosis. The mid vessel has 100% occlusion, haziness c/w thrombus. After the vessel was opened, there is a large obtuse marginal branch.  Right Coronary Artery: Large dominant vessel with serial 30% stenoses in the proximal, mid and distal vessel.  Left Ventricular Angiogram: LVEF=55% with apical hypokinesis.  Impression:  1. Acute anterolateral STEMI with occluded Circumflex, sub-totally occluded mid LAD (both vessels are felt to be possible culprit vessels)  2. Severe double vessel CAD.  3. Preserved LV systolic function with hypokinesis of the apex  4. Successful PTCA/DES x 1 mid Circumflex  5. Successful PTCA/DES x 2 mid LAD  PORTABLE CHEST X-RAY- 12/28/12  IMPRESSION:  There is no evidence of pneumonia nor CHF nor other acute  cardiopulmonary abnormality.  PORTABLE CHEST X-RAY- 12/29/12  IMPRESSION:  No active disease.  TRANSTHORACIC ECHOCARDIOGRAM - 12/29/12  - Left ventricle: The cavity size was normal. Wall thickness was normal. Systolic function was vigorous. The estimated ejection fraction was in the range of 65% to 70%. Wall motion was normal; there were no regional wall motion abnormalities. Left ventricular diastolic function parameters were normal. - Atrial septum: No defect or patent foramen ovale was identified. - Pulmonary arteries: PA peak pressure: 42mm Hg (S).   History of Present Illness Edward Fleming is a 62 y.o. male with no prior cardiac history who was admitted to [facility] from 12/3 to 12/31/12 with the above problem list.  He has a history of hypertension for which he receives care at the [place]. The date of admission, he awoke at 0530 c/o substernal, 8/10 chest pressure with associated diaphoresis. The discomfort persisted and he presented to a local urgent care. There, EKG revealed ST elevations  inferiorly. Labwork indicated a mild hypokalemia (3.4), elevated troponin (4.82), LDL 102, HDL 25, TG 207 168, WBC 11.3, Hgb 12.7 and HCT 37.4. He received ASA 81 x 4 and NTG SL. Code STEMI was called and he was transported emergently to the [facility] cath lab.   Hospital Course   He arrived awake, alert and stable with 3/10 chest pain. EKG on arrival confirmed ST elevation MI.   He underwent cardiac catheterization accessed via the R femoral artery. This revealed culprit lesions in the LAD and LCx, as above. He received DES x 1 to the mid LCx and DES x 2 to the mid LAD. EF was found to be 55%. There was apical hypokinesis. He tolerated the procedure well w/o complications. He was noted to be hypotensive prior to reperfusion. The recommendation was made to continue ASA/Brilinta x 12 months. He was started on metoprolol tartrate and atorvastatin 80. He was transferred to CCU in stable condition.   He remained stable overnight. A post-cath CXR indicated no evidence an acute process. Troponins were cycled and returned > 20.00 as expected. Hgb A1C did return elevated at 6.8% representing a new diagnosis of DM2 for the patient. The patient's leukocytosis trended down. CMET was obtained revealing elevated LFTs- AST 352/ALT 100 on 12/4. This was felt to represent shock liver as opposed to a primary hepatotoxicity such as the addition of a statin. TSH returned WNL.   He underwent a 2D echo showing EF 65-70%, PASP 42 mmHg. This was interpreted as a normal study.   He did have two episodes of nocturnal dyspnea, orthopnea and PND responsive to low doses of IV Lasix. A repeat CXR on 12/4 indicated no evidence of pulmonary edema. This was felt to be a side effect of Brilinta, and this was replaced with Effient. He received a loading dose of 30mg  prior to starting on the usual 10mg  per day regimen.   He ambulated well with cardiac rehab and was transferred to telemetry. LFTs and WBC count have trended down this AM.  H/H have remained stable. He was evaluated by Dr. Patty Sermons and deemed to be stable for discharge. He will be discharged on the medication regimen outlined below. He will follow-up in the [place] office within 7 days as part of transitional care management. He will establish care with Dr. Clifton James, but may follow-up with a PA/NP in the office initially. He will need repeat LFTs at this time. Given labile BPs post-cath, lisinopril was held in order to continue metoprolol. Restarting an ACEi will be re-addressed on follow-up as well. He has been advised to follow-up with his PCP at the [place] in 1-2 weeks for ongoing DM2 management. Diet & lifestyle modifications were stressed prior to initiating the patient on oral hypoglycemics. This information, including post-cath instructions and activity restrictions, has been clearly outlined in the discharge AVS.   Discharge Vitals:  Blood pressure 104/75, pulse 83, temperature 98.1 F (36.7 C), temperature source Oral, resp. rate 19, height 5\' 3"  (1.6 m), weight 83.099 kg (183 lb 3.2 oz), SpO2 97.00%.   Weight change: 0.099 kg (3.5 oz)  Labs: Recent Labs     12/29/12  0405  12/31/12  0353  WBC  13.0*  11.4*  HGB  12.4*  12.7*  HCT  37.1*  38.2*  MCV  93.0  94.3  PLT  237  241    Recent Labs Lab 12/28/12 0950 12/29/12 0405 12/30/12 0510 12/31/12 0353  NA 135 137  --  137  K 3.4* 3.7  --  3.7  CL 97 99  --  98  CO2 21 23  --  29  BUN 21 17  --  17  CREATININE 1.27 0.90  --  1.02  CALCIUM 8.5 9.0  --  8.9  PROT 7.0 7.4 7.4  --   BILITOT 0.2* 0.6 0.8  --   ALKPHOS 82 84 80  --   ALT 40 100* 77*  --   AST 68* 352* 177*  --   GLUCOSE 201* 133*  --  157*   Recent Labs     12/28/12  2340  12/29/12  1350  12/30/12  0510  TROPONINI  >20.00*  >20.00*  >20.00*   Recent Labs     12/29/12  0405  CHOL  178  HDL  28*  LDLCALC  101*  TRIG  243*  CHOLHDL  6.4    Recent Labs  12/28/12 1208  TSH 2.254    Disposition:  Discharge Orders    Future Orders Complete By Expires   Amb Referral to Cardiac Rehabilitation  As directed    Diet - low sodium heart healthy  As directed    Increase activity slowly  As directed          Follow-up Information   Follow up with [facility]. (Office will call with an appointment date & time to be scheduled in 1 week. )    Contact information:   [address] [place] [address]       Follow up with Pcp Not In System. (Please follow-up/establish with a primary care provider in 1-2 weeks for post-hospital follow-up and diabetes management. )       Discharge Medications:    Medication List    STOP taking these medications       lisinopril 20 MG tablet  Commonly known as:  PRINIVIL,ZESTRIL      TAKE these medications       aspirin EC 81 MG tablet  Take 81 mg by mouth at bedtime.     atorvastatin 80 MG tablet  Commonly known as:  LIPITOR  Take 1 tablet (80 mg total) by mouth daily at 6 PM.     furosemide 20 MG tablet  Commonly known as:  LASIX  Take 10 mg by mouth daily before supper.     glucosamine-chondroitin 500-400 MG tablet  Take 2 tablets by mouth at bedtime.     glucose 4 GM chewable tablet  Chew 5 tablets by mouth 2 (two) times daily as needed for low blood sugar.     metoprolol tartrate 25 MG tablet  Commonly known as:  LOPRESSOR  Take 1 tablet (25 mg total) by mouth 2 (two) times daily.     multivitamin with minerals Tabs tablet  Take 1 tablet by mouth at bedtime.     nitroGLYCERIN 0.4 MG SL tablet  Commonly known as:  NITROSTAT  Place 1 tablet (0.4 mg total) under the tongue every 5 (five) minutes x 3 doses as needed for chest pain.     prasugrel 10 MG Tabs tablet  Commonly known as:  EFFIENT  Take 1 tablet (10 mg total) by mouth daily.  RED YEAST RICE PO  Take 2 tablets by mouth at bedtime.       Outstanding Labs/Studies: LFTs in 1 week  Duration of Discharge Encounter: Greater  than 30 minutes including physician time.  Signed, R. Hurman Horn, PA-C 12/31/2012, 11:26 AM

## 2013-01-06 ENCOUNTER — Ambulatory Visit (INDEPENDENT_AMBULATORY_CARE_PROVIDER_SITE_OTHER): Payer: Non-veteran care | Admitting: *Deleted

## 2013-01-06 ENCOUNTER — Encounter: Payer: Self-pay | Admitting: Nurse Practitioner

## 2013-01-06 ENCOUNTER — Ambulatory Visit (INDEPENDENT_AMBULATORY_CARE_PROVIDER_SITE_OTHER): Payer: Non-veteran care | Admitting: Nurse Practitioner

## 2013-01-06 VITALS — BP 113/76 | HR 84 | Ht 63.0 in | Wt 180.0 lb

## 2013-01-06 DIAGNOSIS — E785 Hyperlipidemia, unspecified: Secondary | ICD-10-CM

## 2013-01-06 DIAGNOSIS — I219 Acute myocardial infarction, unspecified: Secondary | ICD-10-CM

## 2013-01-06 DIAGNOSIS — I213 ST elevation (STEMI) myocardial infarction of unspecified site: Secondary | ICD-10-CM

## 2013-01-06 LAB — HEPATIC FUNCTION PANEL
ALT: 49 U/L (ref 0–53)
AST: 35 U/L (ref 0–37)
Albumin: 4.4 g/dL (ref 3.5–5.2)
Alkaline Phosphatase: 96 U/L (ref 39–117)
Total Bilirubin: 0.8 mg/dL (ref 0.3–1.2)
Total Protein: 8.9 g/dL — ABNORMAL HIGH (ref 6.0–8.3)

## 2013-01-06 NOTE — Progress Notes (Signed)
Bradshaw Stitely Date of Birth: 05-19-1950 Medical Record #[id number]  History of Present Illness: Mr. Edward Fleming comes in today for a TOC visit. Seen for Dr. Clifton James. He has HTN, new onset DM, obesity, HLD and past tobacco and alcohol use.   Most recently presented with chest pain - acute anterolateral STEMI treated with DES x 1 mid LCX and DES x 2 to mid LAD. Allergic to Brilinta - on Effient. Had increased LFTs with shock liver that ere trending down and did not impede giving statin therapy. Diagnosed with DM. Gets his primary care thru the [place] system.  Comes back today. Here with his wife. Doing well. No more chest pain. Not short of breath. Some bruising but overall feels well. 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 Tolerating his medicines. Going to the [place] Monday to discuss blood sugar and if he can go to cardiac rehab. He is walking. Stopped smoking 5 months ago.  Current Outpatient Prescriptions  Medication Sig Dispense Refill  . amLODipine (NORVASC) 10 MG tablet Take 5 mg by mouth daily.      Marland Kitchen aspirin EC 81 MG tablet Take 81 mg by mouth at bedtime.      Marland Kitchen atorvastatin (LIPITOR) 80 MG tablet Take 1 tablet (80 mg total) by mouth daily at 6 PM.  30 tablet  3  . glucosamine-chondroitin 500-400 MG tablet Take 2 tablets by mouth at bedtime.      Marland Kitchen glucose 4 GM chewable tablet Chew 5 tablets by mouth 2 (two) times daily as needed for low blood sugar.      . metoprolol tartrate (LOPRESSOR) 25 MG tablet Take 1 tablet (25 mg total) by mouth 2 (two) times daily.  60 tablet  3  . Multiple Vitamin (MULTIVITAMIN WITH MINERALS) TABS tablet Take 1 tablet by mouth at bedtime.      . nitroGLYCERIN (NITROSTAT) 0.4 MG SL tablet Place 1 tablet (0.4 mg total) under the tongue every 5 (five) minutes x 3 doses as needed for chest pain.  25 tablet  3  . prasugrel (EFFIENT) 10 MG TABS tablet Take 1 tablet (10 mg total) by mouth daily.  30 tablet  3   No current  facility-administered medications for this visit.    Allergies  Allergen Reactions  . Ticagrelor     Possible cause of shortness of breath    Past Medical History  Diagnosis Date  . Hypertension   . CAD (coronary artery disease)     a. Anterolateral STEMI 12/28/12 s/p DES-mid LCx, DES x 2-mid LAD b. Echo 12/29/12: EF 65-70%, PASP 42 mmHg, no WMAs  . Type 2 diabetes mellitus     Hgb A1C 6.8% on 12/28/12  . Obesity   . Hyperlipidemia     Past Surgical History  Procedure Laterality Date  . Hand surgery Left   . Coronary angioplasty with stent placement  12/28/2012    20% prox LAD, 99% mid LAD, 90% prox OM1, 100% mid LCx, 30% prox, mid and distal RCA disease; EF 55% with apical hypokinesis    History  Smoking status  . Former Smoker -- 1.00 packs/day for 40 years  . Quit date: 09/28/2012  Smokeless tobacco  . Never Used    History  Alcohol Use No    Comment: No abuse    Family History  Problem Relation Age of Onset  . Heart disease Brother     Review of Systems: The review of systems is per  the HPI.  All other systems were reviewed and are negative.  Physical Exam: BP 113/76  Pulse 84  Ht 5\' 3"  (1.6 m)  Wt 180 lb (81.647 kg)  BMI 31.89 kg/m2 Patient is very pleasant and in no acute distress. Skin is warm and dry. Color is normal.  HEENT is unremarkable except for poor dentition. Normocephalic/atraumatic. PERRL. Sclera are nonicteric. Neck is supple. No masses. No JVD. Lungs are clear. Cardiac exam shows a regular rate and rhythm. Abdomen is soft. Extremities are without edema. Gait and ROM are intact. No gross neurologic deficits noted.  LABORATORY DATA: PENDING  Lab Results  Component Value Date   WBC 11.4* 12/31/2012   HGB 12.7* 12/31/2012   HCT 38.2* 12/31/2012   PLT 241 12/31/2012   GLUCOSE 157* 12/31/2012   CHOL 178 12/29/2012   TRIG 243* 12/29/2012   HDL 28* 12/29/2012   LDLCALC 101* 12/29/2012   ALT 77* 12/30/2012   AST 177* 12/30/2012   NA 137 12/31/2012    K 3.7 12/31/2012   CL 98 12/31/2012   CREATININE 1.02 12/31/2012   BUN 17 12/31/2012   CO2 29 12/31/2012   TSH 2.254 12/28/2012   INR 0.90 12/29/2012   HGBA1C 6.8* 12/28/2012   Lab Results  Component Value Date   CKTOTAL 807* 12/28/2012   CKMB 76.1* 12/28/2012   TROPONINI >20.00* 12/30/2012    Procedure Details:  Emergency consent obtained. No pre-cath creatinine due to emergent nature of procedure with acute MI. Pt hypotensive with 3/10 chest pain upon arrival. The risks, benefits, complications, treatment options, and expected outcomes were discussed with the patient. The patient agree with plan for emergent cath. There was not a palpable radial artery pulse. The patient was sedated with Versed. The right groin was prepped and draped in the usual manner. Using the modified Seldinger access technique, a 6 French sheath was placed in the right femoral artery. Standard diagnostic catheters were used to perform selective coronary angiography. He was found to have acute occlusion of the mid Circumflex artery as well as occlusion of the mid LAD which both were felt to be involved in his presentation. We proceeded to PCI.  PCI Note: The patient was given a weight based bolus of Angiomax and a drip was started. He was given Brilinta 180 mg po x 1. I then engaged the left main with a XB 3.0 guiding catheter.  Lesion #1: When the ACT was over 200, I passed a Cougar IC wire down the Circumflex beyond the total occlusion. A 2.5 x 15 mm balloon was used to pre-dilate the occluded vessel in the mid segment x 2. Flow was restored down the Circumflex. I then deployed a 3.0 x 28 mm Xience Alpine DES in the mid vessel. The stent was post-dilated with a 3.25 x 20 mm [place] balloon x 2. The stenosis was taken from 100% down to 0%. I then turned my attention to the LAD which was sub-totally occluded with haziness throughout the mid vessel and TIMI-1 flow into the distal vessel. He had ongoing chest pain so this vessel was treated  as a possible culprit as well.  Lesion #2: The Cougar IC wire was removed from the Circumflex and advanced down the LAD. I then used a 2.0 x 20 mm balloon to pre-dilate the stenosis in the mid LAD x 6. I then deployed a 2.5 x 38 mm Xience Alpine DES in the mid LAD. A 2.75 x 23 mm Xience Alpine DES was deployed in  an overlapping fashion with the proximal edge of the first stent. The distal stent was post-dilated with a 2.5 x 20 mm [place] balloon x 2. The mid stented segment was post-dilated with a 3.0 x 20 mm [place] balloon x 2. I then post-dilated the most proximal stented segment with a 3.5 x 8 mm [place] balloon x 1. The stenosis was taken from 100% down to 0%.  The guiding catheter was removed. A pigtail catheter was used to perform a left ventricular angiogram. An Angioseal was deployed in the right femoral artery.  There were no immediate complications. The patient was taken to the recovery area in stable condition.  Hemodynamic Findings:  Central aortic pressure: 91/67  Left ventricular pressure: 94/20/27  Angiographic Findings:  Left main: No obstructive disease.  Left Anterior Descending Artery: Large caliber vessel that courses to the apex. The proximal vessel has diffuse 20% stenosis. The mid vessel has 99% sub-total occlusion with haziness throughout the occluded segment and TIMI-1 flow into the distal vessel. There are three small caliber diagonal branches.  Circumflex Artery: Large caliber vessel with two obtuse marginal branches. The first OM branch is a small caliber vessel with proximal 90% stenosis. The mid vessel has 100% occlusion, haziness c/w thrombus. After the vessel was opened, there is a large obtuse marginal branch.  Right Coronary Artery: Large dominant vessel with serial 30% stenoses in the proximal, mid and distal vessel.  Left Ventricular Angiogram: LVEF=55% with apical hypokinesis.  Impression:  1. Acute anterolateral STEMI with occluded Circumflex, sub-totally occluded mid LAD (both  vessels are felt to be possible culprit vessels)  2. Severe double vessel CAD.  3. Preserved LV systolic function with hypokinesis of the apex  4. Successful PTCA/DES x 1 mid Circumflex  5. Successful PTCA/DES x 2 mid LAD  Recommendations: Admit to CCU. He will need one year of ASA and Brilinta. Will start beta blocker and statin. Will start Ace-inh tomorrow if BP is stable. Echo tomorrow.  Complications: None. The patient tolerated the procedure well.   Echo Study Conclusions  - Left ventricle: The cavity size was normal. Wall thickness was normal. Systolic function was vigorous. The estimated ejection fraction was in the range of 65% to 70%. Wall motion was normal; there were no regional wall motion abnormalities. Left ventricular diastolic function parameters were normal. - Atrial septum: No defect or patent foramen ovale was identified. - Pulmonary arteries: PA peak pressure: 42mm Hg (S).   Assessment / Plan: 1. Acute anterolateral STEMI treated with DES to LCX/LAD - on Effient/ASA for one year - probably indefinite. Echo showed normal EF. Try to get him to cardiac rehab. Overall he is doing well. Will return to work as of January 26, 2013.   2. HTN - BP is ok  3. HLD - on statin  4. Shock liver - recheck labs today  Recheck his labs today. See him with fasting labs in 4 weeks.   Patient is agreeable to this plan and will call if any problems develop in the interim.   Rosalio Macadamia, RN, ANP-C [facility] [address] [place], [place]  [address]

## 2013-01-06 NOTE — Patient Instructions (Signed)
Stay on your current medicines  Exercising every day - with a goal of 45 minutes per day  I have sent a note to cardiac rehab  Ok to return to work after the first of the year  We will check labs today  I will see you in a month with fasting labs  Call the [facility] office at [phone] if you have any questions, problems or concerns.

## 2013-01-10 ENCOUNTER — Telehealth: Payer: Self-pay | Admitting: Nurse Practitioner

## 2013-01-10 NOTE — Telephone Encounter (Signed)
Called stating he saw his PCP at the [place] yesterday.  His PCP is wanting to switch him from Effient to Plavix.  If that is OK will need to call pt so he can let the [facility] know that [facility] can [facility].  If Dr. Sanjuana Kava wants him to stay on Effient will need to write letter to [facility] stating reasons he needs to stay on Effient.  States he has not been on Plavix but had a reaction with the Brilinta (causing SOB).  Advised that Dr.McAlhaney is not in the office today but would forward message to him and his nurse Charlotte Crumb.  States he has enough Effient for a month but wanted to get the process started with the [place].

## 2013-01-10 NOTE — Telephone Encounter (Signed)
New Problem:  Pt states from his [facility] visit he now has some questions about his BP medications. Pt has questions about his Effient. Pt states the [facility] is wanting to know if he can be moved to plavix. Pt is requesting a call back from the nurse. Wants to speak about his options.

## 2013-01-11 ENCOUNTER — Encounter: Payer: Self-pay | Admitting: Cardiovascular Disease

## 2013-01-11 NOTE — Telephone Encounter (Signed)
Spoke with pt and gave him information from Dr. Clifton James. Letter will be written indicating why he needs to continue Effient.  Pt requests letter be mailed to him and he will take to [place].

## 2013-01-11 NOTE — Telephone Encounter (Signed)
I would recommend continued Effient. We can write a letter stating this if he needs it. Edward Fleming

## 2013-01-11 NOTE — Telephone Encounter (Signed)
Done. cdm 

## 2013-02-07 ENCOUNTER — Encounter: Payer: Self-pay | Admitting: Nurse Practitioner

## 2013-02-07 ENCOUNTER — Other Ambulatory Visit: Payer: Non-veteran care

## 2013-02-07 ENCOUNTER — Ambulatory Visit (INDEPENDENT_AMBULATORY_CARE_PROVIDER_SITE_OTHER): Payer: Non-veteran care | Admitting: Nurse Practitioner

## 2013-02-07 VITALS — BP 110/90 | HR 97 | Ht 63.0 in | Wt 186.8 lb

## 2013-02-07 DIAGNOSIS — I213 ST elevation (STEMI) myocardial infarction of unspecified site: Secondary | ICD-10-CM

## 2013-02-07 DIAGNOSIS — E782 Mixed hyperlipidemia: Secondary | ICD-10-CM

## 2013-02-07 DIAGNOSIS — I219 Acute myocardial infarction, unspecified: Secondary | ICD-10-CM

## 2013-02-07 LAB — HEPATIC FUNCTION PANEL
ALT: 32 U/L (ref 0–53)
AST: 25 U/L (ref 0–37)
Albumin: 4.2 g/dL (ref 3.5–5.2)
Alkaline Phosphatase: 101 U/L (ref 39–117)
Bilirubin, Direct: 0.1 mg/dL (ref 0.0–0.3)
Total Bilirubin: 0.7 mg/dL (ref 0.3–1.2)
Total Protein: 8.2 g/dL (ref 6.0–8.3)

## 2013-02-07 LAB — BASIC METABOLIC PANEL
BUN: 16 mg/dL (ref 6–23)
CO2: 29 mEq/L (ref 19–32)
Calcium: 9.5 mg/dL (ref 8.4–10.5)
Chloride: 101 mEq/L (ref 96–112)
Creatinine, Ser: 1 mg/dL (ref 0.4–1.5)
GFR: 84.6 mL/min (ref >60.00)
Glucose, Bld: 143 mg/dL — ABNORMAL HIGH (ref 70–99)
Potassium: 3.9 mEq/L (ref 3.5–5.1)
Sodium: 139 mEq/L (ref 135–145)

## 2013-02-07 LAB — CBC
HCT: 39.5 % (ref 39.0–52.0)
Hemoglobin: 13.3 g/dL (ref 13.0–17.0)
MCHC: 33.6 g/dL (ref 30.0–36.0)
MCV: 89.4 fl (ref 78.0–100.0)
Platelets: 285 10*3/uL (ref 150.0–400.0)
RBC: 4.41 Mil/uL (ref 4.22–5.81)
RDW: 14.4 % (ref 11.5–14.6)
WBC: 9.9 10*3/uL (ref 4.5–10.5)

## 2013-02-07 LAB — LIPID PANEL
Cholesterol: 142 mg/dL (ref 0–200)
HDL: 30.4 mg/dL — ABNORMAL LOW (ref >39.00)
Total CHOL/HDL Ratio: 5
Triglycerides: 267 mg/dL — ABNORMAL HIGH (ref 0.0–149.0)
VLDL: 53.4 mg/dL — ABNORMAL HIGH (ref 0.0–40.0)

## 2013-02-07 LAB — LDL CHOLESTEROL, DIRECT: Direct LDL: 78.4 mg/dL

## 2013-02-07 NOTE — Progress Notes (Signed)
Edward Fleming Date of Birth: [birth year] Medical Record #[id number]  History of Present Illness: Edward Fleming is seen back today for a one month check. Seen for Dr. Clifton James. He has HTN, new onset DM, obesity, HLD and past tobacco and alcohol abuse.   Presented with chest pain last month - had an acute anterolateral STEMI treated with DES x 1 mid LCX and DES x 2 to mid LAD. Allergic to Brilinta - on Effient. Did have increased LFTs with shock liver that were trending down and did not impede giving statin therapy. His primary care is thru the [place] system. His echo showed a normal EF. Had already stopped smoking several months prior to this cardiac event.   Seen a month ago - was doing ok. Was to return to work after the first of the [employer].  Comes back today. Here with his wife. Doing for the most part. No chest pain. Some dyspnea with over exertion. No cough. Not really exercising. Did get approval to go to cardiac rehab thru the [place]. Not smoking. He is fasting today. Little frustrated that he can't lose weight but sounds like he has a lot of calories in the form of beverages and needs to work on portion control. Tells me the [facility] is wanting to switch to Plavix in the next couple of months.   Current Outpatient Prescriptions  Medication Sig Dispense Refill  . amLODipine (NORVASC) 10 MG tablet Take 10 mg by mouth daily.       Marland Kitchen aspirin EC 81 MG tablet Take 81 mg by mouth at bedtime.      Marland Kitchen atorvastatin (LIPITOR) 80 MG tablet Take 1 tablet (80 mg total) by mouth daily at 6 PM.  30 tablet  3  . glucosamine-chondroitin 500-400 MG tablet Take 2 tablets by mouth at bedtime.      . metoprolol tartrate (LOPRESSOR) 25 MG tablet Take 1 tablet (25 mg total) by mouth 2 (two) times daily.  60 tablet  3  . Multiple Vitamin (MULTIVITAMIN WITH MINERALS) TABS tablet Take 1 tablet by mouth at bedtime.      . naproxen sodium (ANAPROX) 220 MG tablet Take 220 mg by mouth as needed.      . nitroGLYCERIN (NITROSTAT)  0.4 MG SL tablet Place 1 tablet (0.4 mg total) under the tongue every 5 (five) minutes x 3 doses as needed for chest pain.  25 tablet  3  . prasugrel (EFFIENT) 10 MG TABS tablet Take 1 tablet (10 mg total) by mouth daily.  30 tablet  3  . pseudoephedrine (SUDAFED) 120 MG 12 hr tablet Take 120 mg by mouth daily as needed for congestion.       No current facility-administered medications for this visit.    Allergies  Allergen Reactions  . Ticagrelor     Possible cause of shortness of breath    Past Medical History  Diagnosis Date  . Hypertension   . CAD (coronary artery disease)     a. Anterolateral STEMI 12/28/12 s/p DES-mid LCx, DES x 2-mid LAD b. Echo 12/29/12: EF 65-70%, PASP 42 mmHg, no WMAs  . Type 2 diabetes mellitus     Hgb A1C 6.8% on 12/28/12  . Obesity   . Hyperlipidemia     Past Surgical History  Procedure Laterality Date  . Hand surgery Left   . Coronary angioplasty with stent placement  12/28/2012    20% prox LAD, 99% mid LAD, 90% prox OM1, 100% mid LCx, 30% prox, mid  and distal RCA disease; EF 55% with apical hypokinesis    History  Smoking status  . Former Smoker -- 1.00 packs/day for 40 years  . Quit date: 09/28/2012  Smokeless tobacco  . Never Used    History  Alcohol Use No    Comment: No abuse    Family History  Problem Relation Age of Onset  . Heart disease Brother     Review of Systems: The review of systems is per the HPI.  All other systems were reviewed and are negative.  Physical Exam: BP 110/90  Pulse 97  Ht 5\' 3"  (1.6 m)  Wt 186 lb 12.8 oz (84.732 kg)  BMI 33.10 kg/m2  SpO2 97% Patient is very pleasant and in no acute distress. Skin is warm and dry. Color is normal.  HEENT is unremarkable. Normocephalic/atraumatic. PERRL. Sclera are nonicteric. Neck is supple. No masses. No JVD. Lungs are clear. Cardiac exam shows a regular rate and rhythm. Abdomen is soft. Extremities are without edema. Gait and ROM are intact. No gross neurologic  deficits noted.  Wt Readings from Last 3 Encounters:  02/07/13 186 lb 12.8 oz (84.732 kg)  01/06/13 180 lb (81.647 kg)  12/31/12 183 lb 3.2 oz (83.099 kg)    LABORATORY DATA: Lab Results  Component Value Date   WBC 11.4* 12/31/2012   HGB 12.7* 12/31/2012   HCT 38.2* 12/31/2012   PLT 241 12/31/2012   GLUCOSE 157* 12/31/2012   CHOL 178 12/29/2012   TRIG 243* 12/29/2012   HDL 28* 12/29/2012   LDLCALC 101* 12/29/2012   ALT 49 01/06/2013   AST 35 01/06/2013   NA 137 12/31/2012   K 3.7 12/31/2012   CL 98 12/31/2012   CREATININE 1.02 12/31/2012   BUN 17 12/31/2012   CO2 29 12/31/2012   TSH 2.254 12/28/2012   INR 0.90 12/29/2012   HGBA1C 6.8* 12/28/2012   Procedure Details:  Emergency consent obtained. No pre-cath creatinine due to emergent nature of procedure with acute MI. Pt hypotensive with 3/10 chest pain upon arrival. The risks, benefits, complications, treatment options, and expected outcomes were discussed with the patient. The patient agree with plan for emergent cath. There was not a palpable radial artery pulse. The patient was sedated with Versed. The right groin was prepped and draped in the usual manner. Using the modified Seldinger access technique, a 6 French sheath was placed in the right femoral artery. Standard diagnostic catheters were used to perform selective coronary angiography. He was found to have acute occlusion of the mid Circumflex artery as well as occlusion of the mid LAD which both were felt to be involved in his presentation. We proceeded to PCI.  PCI Note: The patient was given a weight based bolus of Angiomax and a drip was started. He was given Brilinta 180 mg po x 1. I then engaged the left main with a XB 3.0 guiding catheter.  Lesion #1: When the ACT was over 200, I passed a Cougar IC wire down the Circumflex beyond the total occlusion. A 2.5 x 15 mm balloon was used to pre-dilate the occluded vessel in the mid segment x 2. Flow was restored down the Circumflex. I then  deployed a 3.0 x 28 mm Xience Alpine DES in the mid vessel. The stent was post-dilated with a 3.25 x 20 mm [place] balloon x 2. The stenosis was taken from 100% down to 0%. I then turned my attention to the LAD which was sub-totally occluded with haziness throughout the  mid vessel and TIMI-1 flow into the distal vessel. He had ongoing chest pain so this vessel was treated as a possible culprit as well.  Lesion #2: The Cougar IC wire was removed from the Circumflex and advanced down the LAD. I then used a 2.0 x 20 mm balloon to pre-dilate the stenosis in the mid LAD x 6. I then deployed a 2.5 x 38 mm Xience Alpine DES in the mid LAD. A 2.75 x 23 mm Xience Alpine DES was deployed in an overlapping fashion with the proximal edge of the first stent. The distal stent was post-dilated with a 2.5 x 20 mm [place] balloon x 2. The mid stented segment was post-dilated with a 3.0 x 20 mm [place] balloon x 2. I then post-dilated the most proximal stented segment with a 3.5 x 8 mm [place] balloon x 1. The stenosis was taken from 100% down to 0%.  The guiding catheter was removed. A pigtail catheter was used to perform a left ventricular angiogram. An Angioseal was deployed in the right femoral artery.  There were no immediate complications. The patient was taken to the recovery area in stable condition.  Hemodynamic Findings:  Central aortic pressure: 91/67  Left ventricular pressure: 94/20/27  Angiographic Findings:  Left main: No obstructive disease.  Left Anterior Descending Artery: Large caliber vessel that courses to the apex. The proximal vessel has diffuse 20% stenosis. The mid vessel has 99% sub-total occlusion with haziness throughout the occluded segment and TIMI-1 flow into the distal vessel. There are three small caliber diagonal branches.  Circumflex Artery: Large caliber vessel with two obtuse marginal branches. The first OM branch is a small caliber vessel with proximal 90% stenosis. The mid vessel has 100% occlusion,  haziness c/w thrombus. After the vessel was opened, there is a large obtuse marginal branch.  Right Coronary Artery: Large dominant vessel with serial 30% stenoses in the proximal, mid and distal vessel.  Left Ventricular Angiogram: LVEF=55% with apical hypokinesis.  Impression:  1. Acute anterolateral STEMI with occluded Circumflex, sub-totally occluded mid LAD (both vessels are felt to be possible culprit vessels)  2. Severe double vessel CAD.  3. Preserved LV systolic function with hypokinesis of the apex  4. Successful PTCA/DES x 1 mid Circumflex  5. Successful PTCA/DES x 2 mid LAD  Recommendations: Admit to CCU. He will need one year of ASA and Brilinta. Will start beta blocker and statin. Will start Ace-inh tomorrow if BP is stable. Echo tomorrow.  Complications: None. The patient tolerated the procedure well.  Echo Study Conclusions  - Left ventricle: The cavity size was normal. Wall thickness was normal. Systolic function was vigorous. The estimated ejection fraction was in the range of 65% to 70%. Wall motion was normal; there were no regional wall motion abnormalities. Left ventricular diastolic function parameters were normal. - Atrial septum: No defect or patent foramen ovale was identified. - Pulmonary arteries: PA peak pressure: 42mm Hg (S).  Assessment / Plan:  1. Acute anterolateral STEMI treated with DES to LCX/LAD - on Effient/ASA for one year - probably indefinite. Echo showed normal EF. He is doing ok clinically. I have left him on his current regimen. I would want him to stay on his Effient until seen back and will then have Dr. Clifton James decide if Plavix is ok.   2. HTN - BP is ok   3. HLD - on statin - for fasting labs today  4. Shock liver - labs showed improvement - will recheck  today.   5. Dyspnea - most likely has some degree of COPD - he will think about PFTs thru the [place]. Not smoking  Check labs today. See him back in 3 months. Encouraged regular  exercise/weight control encouraged - I don't really get the feeling that he is totally motivated to make changes.   Patient is agreeable to this plan and will call if any problems develop in the interim.   Rosalio Macadamia, RN, ANP-C  [facility]  [address]  [place], [place] [address]

## 2013-02-07 NOTE — Patient Instructions (Addendum)
Stay on your current medicines  We will check fasting labs today  Use Nasocort nasal spray or Coricidin HBP for cold/allergy symptoms  Try to use the NSAID very sparingly - not routinely  I have sent a note to the staff at Cardiac Rehab  See Dr. Clifton James in 3 months  Consider a breathing test at the [facility]   Call the [facility] office at [phone] if you have any questions, problems or concerns.

## 2013-02-16 ENCOUNTER — Inpatient Hospital Stay (HOSPITAL_COMMUNITY): Admission: RE | Admit: 2013-02-16 | Payer: Non-veteran care | Source: Ambulatory Visit

## 2013-02-20 ENCOUNTER — Encounter (HOSPITAL_COMMUNITY): Payer: Non-veteran care

## 2013-02-22 ENCOUNTER — Encounter (HOSPITAL_COMMUNITY): Payer: Non-veteran care

## 2013-02-23 ENCOUNTER — Encounter (HOSPITAL_COMMUNITY)
Admission: RE | Admit: 2013-02-23 | Discharge: 2013-02-23 | Disposition: A | Discharge status: Home / Self Care | Payer: Non-veteran care | Source: Ambulatory Visit | Attending: Cardiovascular Disease | Admitting: Cardiovascular Disease

## 2013-02-23 NOTE — Progress Notes (Signed)
Cardiac Rehab Medication Review by a Pharmacist  Does the patient  feel that his/her medications are working for him/her?  yes  Has the patient been experiencing any side effects to the medications prescribed?  no  Does the patient measure his/her own blood pressure or blood glucose at home?  yes   Does the patient have any problems obtaining medications due to transportation or finances?   no  Understanding of regimen: good Understanding of indications: good Potential of compliance: good    Pharmacist comments:  Mr. Zabaleta is a pleasant 63 yo M with a good understanding of his medication regimen. We discussed the particular indications for each of his medications and the importance of their continued use. He states that he rarely misses any doses of his medication and monitors his blood pressure every 3-4 days.   Margie Billet, PharmD Clinical Pharmacist - Resident Pager: [phone] Pharmacy: [phone] 02/23/2013 9:19 AM

## 2013-02-24 ENCOUNTER — Telehealth: Payer: Self-pay | Admitting: Cardiovascular Disease

## 2013-02-24 ENCOUNTER — Encounter (HOSPITAL_COMMUNITY): Payer: Non-veteran care

## 2013-02-24 ENCOUNTER — Telehealth (HOSPITAL_COMMUNITY): Payer: Self-pay | Admitting: *Deleted

## 2013-02-24 NOTE — Telephone Encounter (Signed)
Message copied by Chelsea Aus on Fri Feb 24, 2013  9:18 AM ------      Message from: Rosalio Macadamia      Created: Fri Feb 24, 2013  7:43 AM      Regarding: RE: Cardiac Rehab Orientation noted irregular heartbeat       Would favor observation at this time since he is asymptomatic.             Lawson Fiscal      ----- Message -----         From: Chelsea Aus, RN         Sent: 02/23/2013  12:07 PM           To: Rosalio Macadamia, NP      Subject: Cardiac Rehab Orientation noted irregular he#                  Pt in today for orientation (cardiac rehab). S/p stemi and des cx and lad x2 on 12/28/12.  Noted his heartbeat was irregular placed him on the monitor that showed PVC's quadrigeminy in nature.  Pt asym with bp 114/70.  Strips faxed to office for your review.  Last f/u was with you on 02/07/13.            Thanks      [employer] RN       ------

## 2013-02-24 NOTE — Telephone Encounter (Signed)
Spoke with pt who reports [facility] has [facility] with [facility] and they need documentation indicating what testing was recommended and reason testing needed. Will send office visit note which recommends PFT's due to dyspnea. If approved through [place] pt reports testing would be arranged through our office. He will contact us if approved.

## 2013-02-24 NOTE — Telephone Encounter (Signed)
New Problem:  Pt is requesting he have a breathing test done but he states the [place] needs approval for the breathing test. The pt is asking we fax approval to [phone] [facility]... Call pt back with any questions

## 2013-02-27 ENCOUNTER — Encounter (HOSPITAL_COMMUNITY)
Admission: RE | Admit: 2013-02-27 | Discharge: 2013-02-27 | Disposition: A | Discharge status: Home / Self Care | Payer: Non-veteran care | Source: Ambulatory Visit | Attending: Cardiovascular Disease | Admitting: Cardiovascular Disease

## 2013-02-27 DIAGNOSIS — E119 Type 2 diabetes mellitus without complications: Secondary | ICD-10-CM | POA: Insufficient documentation

## 2013-02-27 DIAGNOSIS — R0602 Shortness of breath: Secondary | ICD-10-CM | POA: Insufficient documentation

## 2013-02-27 DIAGNOSIS — I2510 Atherosclerotic heart disease of native coronary artery without angina pectoris: Secondary | ICD-10-CM | POA: Insufficient documentation

## 2013-02-27 DIAGNOSIS — E669 Obesity, unspecified: Secondary | ICD-10-CM | POA: Insufficient documentation

## 2013-02-27 DIAGNOSIS — I10 Essential (primary) hypertension: Secondary | ICD-10-CM | POA: Insufficient documentation

## 2013-02-27 DIAGNOSIS — Z5189 Encounter for other specified aftercare: Secondary | ICD-10-CM | POA: Insufficient documentation

## 2013-02-27 DIAGNOSIS — E785 Hyperlipidemia, unspecified: Secondary | ICD-10-CM | POA: Insufficient documentation

## 2013-02-27 DIAGNOSIS — I252 Old myocardial infarction: Secondary | ICD-10-CM | POA: Insufficient documentation

## 2013-02-27 NOTE — Progress Notes (Signed)
Pt started cardiac rehab today.  Pt tolerated light exercise without difficulty. Telemetry rhythm Sinus with frequent PVC's. Patient asymptomatic. Dr Gibson Ramp office aware of ectopy. Vital signs stable. Will continue to monitor the patient throughout  the program.

## 2013-03-01 ENCOUNTER — Encounter (HOSPITAL_COMMUNITY)
Admission: RE | Admit: 2013-03-01 | Discharge: 2013-03-01 | Disposition: A | Discharge status: Home / Self Care | Payer: Non-veteran care | Source: Ambulatory Visit | Attending: Cardiovascular Disease | Admitting: Cardiovascular Disease

## 2013-03-03 ENCOUNTER — Encounter (HOSPITAL_COMMUNITY)
Admission: RE | Admit: 2013-03-03 | Discharge: 2013-03-03 | Disposition: A | Discharge status: Home / Self Care | Payer: Non-veteran care | Source: Ambulatory Visit | Attending: Cardiovascular Disease | Admitting: Cardiovascular Disease

## 2013-03-06 ENCOUNTER — Encounter (HOSPITAL_COMMUNITY): Admission: RE | Admit: 2013-03-06 | Payer: Non-veteran care | Source: Ambulatory Visit

## 2013-03-06 ENCOUNTER — Telehealth (HOSPITAL_COMMUNITY): Payer: Self-pay | Admitting: *Deleted

## 2013-03-06 NOTE — Telephone Encounter (Signed)
Called out today due to attending memorial service.

## 2013-03-08 ENCOUNTER — Encounter (HOSPITAL_COMMUNITY)
Admission: RE | Admit: 2013-03-08 | Discharge: 2013-03-08 | Disposition: A | Discharge status: Home / Self Care | Payer: Non-veteran care | Source: Ambulatory Visit | Attending: Cardiovascular Disease | Admitting: Cardiovascular Disease

## 2013-03-10 ENCOUNTER — Encounter (HOSPITAL_COMMUNITY)
Admission: RE | Admit: 2013-03-10 | Discharge: 2013-03-10 | Disposition: A | Discharge status: Home / Self Care | Payer: Non-veteran care | Source: Ambulatory Visit | Attending: Cardiovascular Disease | Admitting: Cardiovascular Disease

## 2013-03-13 ENCOUNTER — Encounter (HOSPITAL_COMMUNITY): Payer: Non-veteran care

## 2013-03-15 ENCOUNTER — Encounter (HOSPITAL_COMMUNITY)
Admission: RE | Admit: 2013-03-15 | Discharge: 2013-03-15 | Disposition: A | Discharge status: Home / Self Care | Payer: Non-veteran care | Source: Ambulatory Visit | Attending: Cardiovascular Disease | Admitting: Cardiovascular Disease

## 2013-03-17 ENCOUNTER — Encounter (HOSPITAL_COMMUNITY)
Admission: RE | Admit: 2013-03-17 | Discharge: 2013-03-17 | Disposition: A | Discharge status: Home / Self Care | Payer: Non-veteran care | Source: Ambulatory Visit | Attending: Cardiovascular Disease | Admitting: Cardiovascular Disease

## 2013-03-17 NOTE — Progress Notes (Signed)
Reviewed home exercise with pt today.  Pt plans to walk and use treadmill at home for exercise.  Reviewed THR, pulse (plans to get pulse oximeter), RPE, sign and symptoms, NTG use, and when to call 911 or MD.  Pt voiced understanding.\ Fabio Pierce, MA, ACSM RCEP

## 2013-03-20 ENCOUNTER — Encounter (HOSPITAL_COMMUNITY)
Admission: RE | Admit: 2013-03-20 | Discharge: 2013-03-20 | Disposition: A | Discharge status: Home / Self Care | Payer: Non-veteran care | Source: Ambulatory Visit | Attending: Cardiovascular Disease | Admitting: Cardiovascular Disease

## 2013-03-22 ENCOUNTER — Encounter (HOSPITAL_COMMUNITY)
Admission: RE | Admit: 2013-03-22 | Discharge: 2013-03-22 | Disposition: A | Discharge status: Home / Self Care | Payer: Non-veteran care | Source: Ambulatory Visit | Attending: Cardiovascular Disease | Admitting: Cardiovascular Disease

## 2013-03-24 ENCOUNTER — Encounter (HOSPITAL_COMMUNITY)
Admission: RE | Admit: 2013-03-24 | Discharge: 2013-03-24 | Disposition: A | Discharge status: Home / Self Care | Payer: Non-veteran care | Source: Ambulatory Visit | Attending: Cardiovascular Disease | Admitting: Cardiovascular Disease

## 2013-03-27 ENCOUNTER — Encounter (HOSPITAL_COMMUNITY): Admission: RE | Admit: 2013-03-27 | Payer: Non-veteran care | Source: Ambulatory Visit

## 2013-03-29 ENCOUNTER — Telehealth (HOSPITAL_COMMUNITY): Payer: Self-pay

## 2013-03-29 ENCOUNTER — Encounter (HOSPITAL_COMMUNITY): Payer: Non-veteran care

## 2013-03-31 ENCOUNTER — Encounter (HOSPITAL_COMMUNITY): Payer: Non-veteran care

## 2013-04-03 ENCOUNTER — Encounter (HOSPITAL_COMMUNITY)
Admission: RE | Admit: 2013-04-03 | Discharge: 2013-04-03 | Disposition: A | Discharge status: Home / Self Care | Payer: Non-veteran care | Source: Ambulatory Visit | Attending: Cardiovascular Disease | Admitting: Cardiovascular Disease

## 2013-04-03 DIAGNOSIS — I2510 Atherosclerotic heart disease of native coronary artery without angina pectoris: Secondary | ICD-10-CM | POA: Insufficient documentation

## 2013-04-03 DIAGNOSIS — E669 Obesity, unspecified: Secondary | ICD-10-CM | POA: Insufficient documentation

## 2013-04-03 DIAGNOSIS — I10 Essential (primary) hypertension: Secondary | ICD-10-CM | POA: Insufficient documentation

## 2013-04-03 DIAGNOSIS — Z5189 Encounter for other specified aftercare: Secondary | ICD-10-CM | POA: Insufficient documentation

## 2013-04-03 DIAGNOSIS — I252 Old myocardial infarction: Secondary | ICD-10-CM | POA: Insufficient documentation

## 2013-04-03 DIAGNOSIS — E785 Hyperlipidemia, unspecified: Secondary | ICD-10-CM | POA: Insufficient documentation

## 2013-04-03 DIAGNOSIS — E119 Type 2 diabetes mellitus without complications: Secondary | ICD-10-CM | POA: Insufficient documentation

## 2013-04-03 DIAGNOSIS — R0602 Shortness of breath: Secondary | ICD-10-CM | POA: Insufficient documentation

## 2013-04-03 NOTE — Progress Notes (Signed)
Pt stopped by today to let us know that he will be dropping from the program.  The schedule is interfering with his work.  He plans to continue to exercise by walking and using his treadmill at home.  Thanks for the referral. Fabio Pierce, MA, ACSM RCEP

## 2013-04-05 ENCOUNTER — Encounter (HOSPITAL_COMMUNITY): Payer: Non-veteran care

## 2013-04-07 ENCOUNTER — Encounter (HOSPITAL_COMMUNITY): Payer: Non-veteran care

## 2013-04-07 ENCOUNTER — Telehealth: Payer: Self-pay | Admitting: Cardiovascular Disease

## 2013-04-07 NOTE — Telephone Encounter (Signed)
New message    Is  37 authorize cardiac rehab visit are include with MD visit.    Message will be forward to Precret dept as well as Nurse for review.

## 2013-04-07 NOTE — Telephone Encounter (Signed)
No answer at number listed.  Mylo Red RN

## 2013-04-10 ENCOUNTER — Encounter (HOSPITAL_COMMUNITY): Payer: Non-veteran care

## 2013-04-11 NOTE — Telephone Encounter (Signed)
Call back number is for [facility] in [place]. I spoke with operator and they do not have [facility] or a person named [place] at the [place].

## 2013-04-12 ENCOUNTER — Encounter (HOSPITAL_COMMUNITY): Payer: Non-veteran care

## 2013-04-14 ENCOUNTER — Encounter (HOSPITAL_COMMUNITY): Payer: Non-veteran care

## 2013-04-17 ENCOUNTER — Encounter (HOSPITAL_COMMUNITY): Payer: Non-veteran care

## 2013-04-19 ENCOUNTER — Encounter (HOSPITAL_COMMUNITY): Payer: Non-veteran care

## 2013-04-21 ENCOUNTER — Encounter (HOSPITAL_COMMUNITY): Payer: Non-veteran care

## 2013-04-24 ENCOUNTER — Encounter (HOSPITAL_COMMUNITY): Payer: Non-veteran care

## 2013-04-24 NOTE — Telephone Encounter (Signed)
Received fax back from [place] with records we sent and note stating " Edward Fleming does not have an authorization for this care. Records have been sent to Health Information Management."  I called and spoke with pt who reports [facility] has been in [facility] with him and he is going for office visit at [facility] next week.  He thinks testing will be done at that time.  Pt aware testing that was suggested is Pulmonary Function Tests.

## 2013-04-26 ENCOUNTER — Encounter (HOSPITAL_COMMUNITY): Payer: Non-veteran care

## 2013-04-28 ENCOUNTER — Encounter (HOSPITAL_COMMUNITY): Payer: Non-veteran care

## 2013-05-01 ENCOUNTER — Encounter (HOSPITAL_COMMUNITY): Payer: Non-veteran care

## 2013-05-03 ENCOUNTER — Encounter (HOSPITAL_COMMUNITY): Payer: Non-veteran care

## 2013-05-05 ENCOUNTER — Encounter (HOSPITAL_COMMUNITY): Payer: Non-veteran care

## 2013-05-08 ENCOUNTER — Encounter (HOSPITAL_COMMUNITY): Payer: Non-veteran care

## 2013-05-10 ENCOUNTER — Encounter (HOSPITAL_COMMUNITY): Payer: Non-veteran care

## 2013-05-12 ENCOUNTER — Encounter (HOSPITAL_COMMUNITY): Payer: Non-veteran care

## 2013-05-15 ENCOUNTER — Encounter (HOSPITAL_COMMUNITY): Payer: Non-veteran care

## 2013-05-17 ENCOUNTER — Encounter (HOSPITAL_COMMUNITY): Payer: Non-veteran care

## 2013-05-19 ENCOUNTER — Encounter (HOSPITAL_COMMUNITY): Payer: Non-veteran care

## 2013-05-22 ENCOUNTER — Encounter (HOSPITAL_COMMUNITY): Payer: Non-veteran care

## 2013-05-24 ENCOUNTER — Encounter (HOSPITAL_COMMUNITY): Payer: Non-veteran care

## 2013-05-26 ENCOUNTER — Encounter (HOSPITAL_COMMUNITY): Payer: Non-veteran care

## 2013-06-07 ENCOUNTER — Encounter: Payer: Non-veteran care | Admitting: Nurse Practitioner

## 2013-06-07 ENCOUNTER — Encounter: Payer: Self-pay | Admitting: Nurse Practitioner

## 2013-06-07 VITALS — BP 110/80 | HR 64 | Ht 63.0 in | Wt 190.8 lb

## 2013-06-07 DIAGNOSIS — I259 Chronic ischemic heart disease, unspecified: Secondary | ICD-10-CM

## 2013-06-07 DIAGNOSIS — E785 Hyperlipidemia, unspecified: Secondary | ICD-10-CM

## 2013-06-07 DIAGNOSIS — I10 Essential (primary) hypertension: Secondary | ICD-10-CM

## 2013-06-07 NOTE — Patient Instructions (Signed)
Stay on your current medicines  See Dr. Clifton James back in   Call the [facility] office at [phone] if you have any questions, problems or concerns.

## 2013-06-07 NOTE — Progress Notes (Signed)
Edward Fleming Date of Birth: Dec 09, 1950 Medical Record #[id number]  History of Present Illness: Edward Fleming is seen back today for a follow up visit. This is a 4 month check. Seen for Edward Fleming. He has HTN, DM, obesity, HLD and past tobacco and alcohol abuse.   Presented in December of 2014 with an acute anterolateral STEMI treated with DES x 1 mid LCX and DES x 2 to mid LAD. Allergic to Brilinta - on Effient. Did have increased LFTs with shock liver that were trending down and did not impede giving statin therapy. His primary care is thru the [place] system. His echo showed a normal EF. Had already stopped smoking several months prior to this cardiac event.   Comes back today. Here with his wife. He was quite upset with several aspects of his care. He was directed to the Development worker, international aid. His visit with me was thus cancelled.   Current Outpatient Prescriptions  Medication Sig Dispense Refill  . amLODipine (NORVASC) 10 MG tablet Take 10 mg by mouth daily.       Marland Kitchen aspirin EC 81 MG tablet Take 81 mg by mouth daily.       Marland Kitchen atorvastatin (LIPITOR) 80 MG tablet Take 80 mg by mouth daily at 6 PM.      . clopidogrel (PLAVIX) 75 MG tablet Take 75 mg by mouth daily with breakfast.      . glucosamine-chondroitin 500-400 MG tablet Take 2 tablets by mouth at bedtime.      . metoprolol tartrate (LOPRESSOR) 25 MG tablet Take 1 tablet (25 mg total) by mouth 2 (two) times daily.  60 tablet  3  . Multiple Vitamin (MULTIVITAMIN WITH MINERALS) TABS tablet Take 1 tablet by mouth at bedtime.      . nitroGLYCERIN (NITROSTAT) 0.4 MG SL tablet Place 1 tablet (0.4 mg total) under the tongue every 5 (five) minutes x 3 doses as needed for chest pain.  25 tablet  3   No current facility-administered medications for this visit.    Allergies  Allergen Reactions  . Ticagrelor     Possible cause of shortness of breath    Past Medical History  Diagnosis Date  . Hypertension   . CAD (coronary artery disease)    a. Anterolateral STEMI 12/28/12 s/p DES-mid LCx, DES x 2-mid LAD b. Echo 12/29/12: EF 65-70%, PASP 42 mmHg, no WMAs  . Type 2 diabetes mellitus     Hgb A1C 6.8% on 12/28/12  . Obesity   . Hyperlipidemia     Past Surgical History  Procedure Laterality Date  . Hand surgery Left   . Coronary angioplasty with stent placement  12/28/2012    20% prox LAD, 99% mid LAD, 90% prox OM1, 100% mid LCx, 30% prox, mid and distal RCA disease; EF 55% with apical hypokinesis    History  Smoking status  . Former Smoker -- 1.00 packs/day for 40 years  . Quit date: 09/28/2012  Smokeless tobacco  . Never Used    History  Alcohol Use No    Comment: No abuse    Family History  Problem Relation Age of Onset  . Heart disease Brother     Review of Systems: The review of systems is per the HPI.  All other systems were reviewed and are negative.  Physical Exam: Ht 5\' 3"  (1.6 m)  Wt 190 lb 12.8 oz (86.546 kg)  BMI 33.81 kg/m2 He was not examined.  Wt Readings from Last 3 Encounters:  06/07/13  190 lb 12.8 oz (86.546 kg)  02/23/13 186 lb 1.1 oz (84.4 kg)  02/07/13 186 lb 12.8 oz (84.732 kg)     LABORATORY DATA: Lab Results  Component Value Date   WBC 9.9 02/07/2013   HGB 13.3 02/07/2013   HCT 39.5 02/07/2013   PLT 285.0 02/07/2013   GLUCOSE 143* 02/07/2013   CHOL 142 02/07/2013   TRIG 267.0* 02/07/2013   HDL 30.40* 02/07/2013   LDLDIRECT 78.4 02/07/2013   LDLCALC 101* 12/29/2012   ALT 32 02/07/2013   AST 25 02/07/2013   NA 139 02/07/2013   K 3.9 02/07/2013   CL 101 02/07/2013   CREATININE 1.0 02/07/2013   BUN 16 02/07/2013   CO2 29 02/07/2013   TSH 2.254 12/28/2012   INR 0.90 12/29/2012   HGBA1C 6.8* 12/28/2012   Procedure Details:  Emergency consent obtained. No pre-cath creatinine due to emergent nature of procedure with acute MI. Pt hypotensive with 3/10 chest pain upon arrival. The risks, benefits, complications, treatment options, and expected outcomes were discussed with the patient. The  patient agree with plan for emergent cath. There was not a palpable radial artery pulse. The patient was sedated with Versed. The right groin was prepped and draped in the usual manner. Using the modified Seldinger access technique, a 6 French sheath was placed in the right femoral artery. Standard diagnostic catheters were used to perform selective coronary angiography. He was found to have acute occlusion of the mid Circumflex artery as well as occlusion of the mid LAD which both were felt to be involved in his presentation. We proceeded to PCI.  PCI Note: The patient was given a weight based bolus of Angiomax and a drip was started. He was given Brilinta 180 mg po x 1. I then engaged the left main with a XB 3.0 guiding catheter.  Lesion #1: When the ACT was over 200, I passed a Cougar IC wire down the Circumflex beyond the total occlusion. A 2.5 x 15 mm balloon was used to pre-dilate the occluded vessel in the mid segment x 2. Flow was restored down the Circumflex. I then deployed a 3.0 x 28 mm Xience Alpine DES in the mid vessel. The stent was post-dilated with a 3.25 x 20 mm [place] balloon x 2. The stenosis was taken from 100% down to 0%. I then turned my attention to the LAD which was sub-totally occluded with haziness throughout the mid vessel and TIMI-1 flow into the distal vessel. He had ongoing chest pain so this vessel was treated as a possible culprit as well.  Lesion #2: The Cougar IC wire was removed from the Circumflex and advanced down the LAD. I then used a 2.0 x 20 mm balloon to pre-dilate the stenosis in the mid LAD x 6. I then deployed a 2.5 x 38 mm Xience Alpine DES in the mid LAD. A 2.75 x 23 mm Xience Alpine DES was deployed in an overlapping fashion with the proximal edge of the first stent. The distal stent was post-dilated with a 2.5 x 20 mm [place] balloon x 2. The mid stented segment was post-dilated with a 3.0 x 20 mm [place] balloon x 2. I then post-dilated the most proximal stented segment with a  3.5 x 8 mm [place] balloon x 1. The stenosis was taken from 100% down to 0%.  The guiding catheter was removed. A pigtail catheter was used to perform a left ventricular angiogram. An Angioseal was deployed in the right femoral artery.  There were no immediate complications. The patient was taken to the recovery area in stable condition.  Hemodynamic Findings:  Central aortic pressure: 91/67  Left ventricular pressure: 94/20/27  Angiographic Findings:  Left main: No obstructive disease.  Left Anterior Descending Artery: Large caliber vessel that courses to the apex. The proximal vessel has diffuse 20% stenosis. The mid vessel has 99% sub-total occlusion with haziness throughout the occluded segment and TIMI-1 flow into the distal vessel. There are three small caliber diagonal branches.  Circumflex Artery: Large caliber vessel with two obtuse marginal branches. The first OM branch is a small caliber vessel with proximal 90% stenosis. The mid vessel has 100% occlusion, haziness c/w thrombus. After the vessel was opened, there is a large obtuse marginal branch.  Right Coronary Artery: Large dominant vessel with serial 30% stenoses in the proximal, mid and distal vessel.  Left Ventricular Angiogram: LVEF=55% with apical hypokinesis.  Impression:  1. Acute anterolateral STEMI with occluded Circumflex, sub-totally occluded mid LAD (both vessels are felt to be possible culprit vessels)  2. Severe double vessel CAD.  3. Preserved LV systolic function with hypokinesis of the apex  4. Successful PTCA/DES x 1 mid Circumflex  5. Successful PTCA/DES x 2 mid LAD  Recommendations: Admit to CCU. He will need one year of ASA and Brilinta. Will start beta blocker and statin. Will start Ace-inh tomorrow if BP is stable. Echo tomorrow.  Complications: None. The patient tolerated the procedure well.  Echo Study Conclusions  - Left ventricle: The cavity size was normal. Wall thickness was normal. Systolic function was  vigorous. The estimated ejection fraction was in the range of 65% to 70%. Wall motion was normal; there were no regional wall motion abnormalities. Left ventricular diastolic function parameters were normal. - Atrial septum: No defect or patent foramen ovale was identified. - Pulmonary arteries: PA peak pressure: 42mm Hg (S).  Assessment / Plan:  1. Acute anterolateral STEMI treated with DES to LCX/LAD -   2. HTN -   3. HLD -   4. Shock liver -   5. Dyspnea -    Rosalio MacadamiaLori C. Tim Corriher, RN, ANP-C [facility] [address] [place], [place]  [id number] [phone]   This encounter was created in error - please disregard.

## 2013-06-14 ENCOUNTER — Telehealth: Payer: Self-pay

## 2013-06-14 NOTE — Telephone Encounter (Signed)
Spoke with Edward Fleming via telephone today. He understands the need for continuity of his care and was encouraged to follow up with [place] as needed. I offered him an appointment with Dr. Clifton James in early June and he refused, and he will be going to the [facility] for his further treatment.

## 2014-01-04 ENCOUNTER — Encounter (HOSPITAL_COMMUNITY): Payer: Self-pay | Admitting: Cardiovascular Disease

## 2018-10-24 ENCOUNTER — Inpatient Hospital Stay (HOSPITAL_COMMUNITY)
Admission: EM | Admit: 2018-10-24 | Discharge: 2018-10-26 | DRG: 378 | Disposition: A | Discharge status: Home / Self Care | Payer: Medicare HMO | Attending: Internal Medicine | Admitting: Internal Medicine

## 2018-10-24 ENCOUNTER — Emergency Department (HOSPITAL_COMMUNITY): Payer: Medicare HMO

## 2018-10-24 ENCOUNTER — Other Ambulatory Visit: Payer: Self-pay

## 2018-10-24 ENCOUNTER — Inpatient Hospital Stay (HOSPITAL_COMMUNITY): Payer: Medicare HMO

## 2018-10-24 ENCOUNTER — Encounter (HOSPITAL_COMMUNITY): Payer: Self-pay

## 2018-10-24 DIAGNOSIS — I248 Other forms of acute ischemic heart disease: Secondary | ICD-10-CM | POA: Diagnosis present

## 2018-10-24 DIAGNOSIS — E538 Deficiency of other specified B group vitamins: Secondary | ICD-10-CM | POA: Diagnosis present

## 2018-10-24 DIAGNOSIS — Z23 Encounter for immunization: Secondary | ICD-10-CM

## 2018-10-24 DIAGNOSIS — Z20828 Contact with and (suspected) exposure to other viral communicable diseases: Secondary | ICD-10-CM | POA: Diagnosis present

## 2018-10-24 DIAGNOSIS — I252 Old myocardial infarction: Secondary | ICD-10-CM

## 2018-10-24 DIAGNOSIS — Z8249 Family history of ischemic heart disease and other diseases of the circulatory system: Secondary | ICD-10-CM

## 2018-10-24 DIAGNOSIS — Z87442 Personal history of urinary calculi: Secondary | ICD-10-CM

## 2018-10-24 DIAGNOSIS — K2970 Gastritis, unspecified, without bleeding: Secondary | ICD-10-CM | POA: Diagnosis present

## 2018-10-24 DIAGNOSIS — R55 Syncope and collapse: Secondary | ICD-10-CM

## 2018-10-24 DIAGNOSIS — I2510 Atherosclerotic heart disease of native coronary artery without angina pectoris: Secondary | ICD-10-CM | POA: Diagnosis present

## 2018-10-24 DIAGNOSIS — E785 Hyperlipidemia, unspecified: Secondary | ICD-10-CM | POA: Diagnosis present

## 2018-10-24 DIAGNOSIS — K921 Melena: Secondary | ICD-10-CM

## 2018-10-24 DIAGNOSIS — Z79899 Other long term (current) drug therapy: Secondary | ICD-10-CM | POA: Diagnosis not present

## 2018-10-24 DIAGNOSIS — I10 Essential (primary) hypertension: Secondary | ICD-10-CM | POA: Diagnosis present

## 2018-10-24 DIAGNOSIS — E119 Type 2 diabetes mellitus without complications: Secondary | ICD-10-CM

## 2018-10-24 DIAGNOSIS — Z7982 Long term (current) use of aspirin: Secondary | ICD-10-CM | POA: Diagnosis not present

## 2018-10-24 DIAGNOSIS — Z7902 Long term (current) use of antithrombotics/antiplatelets: Secondary | ICD-10-CM | POA: Diagnosis not present

## 2018-10-24 DIAGNOSIS — Z888 Allergy status to other drugs, medicaments and biological substances status: Secondary | ICD-10-CM

## 2018-10-24 DIAGNOSIS — D509 Iron deficiency anemia, unspecified: Secondary | ICD-10-CM | POA: Diagnosis present

## 2018-10-24 DIAGNOSIS — Z87891 Personal history of nicotine dependence: Secondary | ICD-10-CM | POA: Diagnosis not present

## 2018-10-24 DIAGNOSIS — Z955 Presence of coronary angioplasty implant and graft: Secondary | ICD-10-CM

## 2018-10-24 DIAGNOSIS — D62 Acute posthemorrhagic anemia: Secondary | ICD-10-CM | POA: Diagnosis present

## 2018-10-24 DIAGNOSIS — K922 Gastrointestinal hemorrhage, unspecified: Secondary | ICD-10-CM

## 2018-10-24 DIAGNOSIS — K264 Chronic or unspecified duodenal ulcer with hemorrhage: Secondary | ICD-10-CM | POA: Diagnosis present

## 2018-10-24 LAB — COMPREHENSIVE METABOLIC PANEL
ALT: 24 U/L (ref 0–44)
AST: 21 U/L (ref 15–41)
Albumin: 3.3 g/dL — ABNORMAL LOW (ref 3.5–5.0)
Alkaline Phosphatase: 60 U/L (ref 38–126)
Anion gap: 9 (ref 5–15)
BUN: 60 mg/dL — ABNORMAL HIGH (ref 8–23)
CO2: 24 mmol/L (ref 22–32)
Calcium: 8 mg/dL — ABNORMAL LOW (ref 8.9–10.3)
Chloride: 106 mmol/L (ref 98–111)
Creatinine, Ser: 1.07 mg/dL (ref 0.61–1.24)
GFR calc Af Amer: >60 mL/min (ref >60)
GFR calc non Af Amer: >60 mL/min (ref >60)
Glucose, Bld: 163 mg/dL — ABNORMAL HIGH (ref 70–99)
Potassium: 4.2 mmol/L (ref 3.5–5.1)
Sodium: 139 mmol/L (ref 135–145)
Total Bilirubin: 0.5 mg/dL (ref 0.3–1.2)
Total Protein: 6.1 g/dL — ABNORMAL LOW (ref 6.5–8.1)

## 2018-10-24 LAB — GLUCOSE, CAPILLARY
Glucose-Capillary: 113 mg/dL — ABNORMAL HIGH (ref 70–99)
Glucose-Capillary: 122 mg/dL — ABNORMAL HIGH (ref 70–99)
Glucose-Capillary: 173 mg/dL — ABNORMAL HIGH (ref 70–99)

## 2018-10-24 LAB — CBC
HCT: 24.5 % — ABNORMAL LOW (ref 39.0–52.0)
Hemoglobin: 7.5 g/dL — ABNORMAL LOW (ref 13.0–17.0)
MCH: 30.1 pg (ref 26.0–34.0)
MCHC: 30.6 g/dL (ref 30.0–36.0)
MCV: 98.4 fL (ref 80.0–100.0)
Platelets: 262 10*3/uL (ref 150–400)
RBC: 2.49 MIL/uL — ABNORMAL LOW (ref 4.22–5.81)
RDW: 15 % (ref 11.5–15.5)
WBC: 10.1 10*3/uL (ref 4.0–10.5)
nRBC: 0 % (ref 0.0–0.2)

## 2018-10-24 LAB — IRON AND TIBC
Iron: 25 ug/dL — ABNORMAL LOW (ref 45–182)
Saturation Ratios: 6 % — ABNORMAL LOW (ref 17.9–39.5)
TIBC: 397 ug/dL (ref 250–450)
UIBC: 372 ug/dL

## 2018-10-24 LAB — SARS CORONAVIRUS 2 BY RT PCR (HOSPITAL ORDER, PERFORMED IN ~~LOC~~ HOSPITAL LAB): SARS Coronavirus 2: NEGATIVE

## 2018-10-24 LAB — CBC WITH DIFFERENTIAL/PLATELET
Abs Immature Granulocytes: 0.05 10*3/uL (ref 0.00–0.07)
Basophils Absolute: 0.1 10*3/uL (ref 0.0–0.1)
Basophils Relative: 1 %
Eosinophils Absolute: 0.1 10*3/uL (ref 0.0–0.5)
Eosinophils Relative: 1 %
HCT: 24.7 % — ABNORMAL LOW (ref 39.0–52.0)
Hemoglobin: 7.9 g/dL — ABNORMAL LOW (ref 13.0–17.0)
Immature Granulocytes: 0 %
Lymphocytes Relative: 14 %
Lymphs Abs: 1.6 10*3/uL (ref 0.7–4.0)
MCH: 30.7 pg (ref 26.0–34.0)
MCHC: 32 g/dL (ref 30.0–36.0)
MCV: 96.1 fL (ref 80.0–100.0)
Monocytes Absolute: 0.8 10*3/uL (ref 0.1–1.0)
Monocytes Relative: 7 %
Neutro Abs: 9.2 10*3/uL — ABNORMAL HIGH (ref 1.7–7.7)
Neutrophils Relative %: 77 %
Platelets: 264 10*3/uL (ref 150–400)
RBC: 2.57 MIL/uL — ABNORMAL LOW (ref 4.22–5.81)
RDW: 14.8 % (ref 11.5–15.5)
WBC: 11.8 10*3/uL — ABNORMAL HIGH (ref 4.0–10.5)
nRBC: 0 % (ref 0.0–0.2)

## 2018-10-24 LAB — ECHOCARDIOGRAM COMPLETE
Height: 63 in
Weight: 2536.17 oz

## 2018-10-24 LAB — D-DIMER, QUANTITATIVE: D-Dimer, Quant: 0.64 ug/mL-FEU — ABNORMAL HIGH (ref 0.00–0.50)

## 2018-10-24 LAB — BRAIN NATRIURETIC PEPTIDE: B Natriuretic Peptide: 27 pg/mL (ref 0.0–100.0)

## 2018-10-24 LAB — CBG MONITORING, ED
Glucose-Capillary: 95 mg/dL (ref 70–99)
Glucose-Capillary: 125 mg/dL — ABNORMAL HIGH (ref 70–99)

## 2018-10-24 LAB — FOLATE: Folate: 13.6 ng/mL (ref >5.9)

## 2018-10-24 LAB — HEMOGLOBIN A1C
Hgb A1c MFr Bld: 6.4 % — ABNORMAL HIGH (ref 4.8–5.6)
Mean Plasma Glucose: 136.98 mg/dL

## 2018-10-24 LAB — TROPONIN I (HIGH SENSITIVITY)
Troponin I (High Sensitivity): 16 ng/L (ref <18)
Troponin I (High Sensitivity): 31 ng/L — ABNORMAL HIGH (ref <18)
Troponin I (High Sensitivity): 92 ng/L — ABNORMAL HIGH (ref <18)

## 2018-10-24 LAB — MRSA PCR SCREENING: MRSA by PCR: NEGATIVE

## 2018-10-24 LAB — VITAMIN B12: Vitamin B-12: 165 pg/mL — ABNORMAL LOW (ref 180–914)

## 2018-10-24 LAB — POC OCCULT BLOOD, ED: Fecal Occult Bld: POSITIVE — AB

## 2018-10-24 LAB — FERRITIN: Ferritin: 11 ng/mL — ABNORMAL LOW (ref 24–336)

## 2018-10-24 MED ORDER — INFLUENZA VAC A&B SA ADJ QUAD 0.5 ML IM PRSY
0.5000 mL | PREFILLED_SYRINGE | INTRAMUSCULAR | Status: AC
  Administered 2018-10-26: 0.5 mL via INTRAMUSCULAR
  Filled 2018-10-24: qty 0.5

## 2018-10-24 MED ORDER — INSULIN ASPART 100 UNIT/ML ~~LOC~~ SOLN
0.0000 [IU] | SUBCUTANEOUS | Status: DC
  Administered 2018-10-24: 1 [IU] via SUBCUTANEOUS
  Administered 2018-10-24: 20:00:00 2 [IU] via SUBCUTANEOUS
  Filled 2018-10-24: qty 1

## 2018-10-24 MED ORDER — PANTOPRAZOLE SODIUM 40 MG IV SOLR
INTRAVENOUS | Status: AC
  Filled 2018-10-24: qty 160

## 2018-10-24 MED ORDER — CHLORHEXIDINE GLUCONATE CLOTH 2 % EX PADS
6.0000 | MEDICATED_PAD | Freq: Every day | CUTANEOUS | Status: DC
  Administered 2018-10-24 – 2018-10-26 (×2): 6 via TOPICAL

## 2018-10-24 MED ORDER — ACETAMINOPHEN 325 MG PO TABS
650.0000 mg | ORAL_TABLET | Freq: Four times a day (QID) | ORAL | Status: DC | PRN
  Administered 2018-10-25 (×2): 650 mg via ORAL
  Filled 2018-10-24 (×2): qty 2

## 2018-10-24 MED ORDER — IOHEXOL 350 MG/ML SOLN
100.0000 mL | Freq: Once | INTRAVENOUS | Status: AC | PRN
  Administered 2018-10-24: 12:00:00 100 mL via INTRAVENOUS

## 2018-10-24 MED ORDER — ACETAMINOPHEN 650 MG RE SUPP: 650.0000 mg | Freq: Four times a day (QID) | RECTAL | Status: DC | PRN

## 2018-10-24 MED ORDER — METOPROLOL TARTRATE 25 MG PO TABS
25.0000 mg | ORAL_TABLET | Freq: Two times a day (BID) | ORAL | Status: DC
  Administered 2018-10-24 (×2): 25 mg via ORAL
  Filled 2018-10-24 (×2): qty 1

## 2018-10-24 MED ORDER — SODIUM CHLORIDE 0.9 % IV SOLN
INTRAVENOUS | Status: DC
  Administered 2018-10-24: 08:00:00 via INTRAVENOUS

## 2018-10-24 MED ORDER — ONDANSETRON HCL 4 MG/2ML IJ SOLN: 4.0000 mg | Freq: Four times a day (QID) | INTRAMUSCULAR | Status: DC | PRN

## 2018-10-24 MED ORDER — PANTOPRAZOLE SODIUM 40 MG IV SOLR
80.0000 mg | Freq: Once | INTRAVENOUS | Status: AC
  Administered 2018-10-24: 80 mg via INTRAVENOUS
  Filled 2018-10-24: qty 80

## 2018-10-24 MED ORDER — SODIUM CHLORIDE 0.9 % IV SOLN
8.0000 mg/h | INTRAVENOUS | Status: DC
  Administered 2018-10-24 – 2018-10-25 (×4): 8 mg/h via INTRAVENOUS
  Filled 2018-10-24 (×6): qty 80

## 2018-10-24 MED ORDER — PANTOPRAZOLE SODIUM 40 MG IV SOLR: 40.0000 mg | Freq: Two times a day (BID) | INTRAVENOUS | Status: DC

## 2018-10-24 NOTE — ED Notes (Signed)
Family at bedside. 

## 2018-10-24 NOTE — Progress Notes (Signed)
  Echocardiogram 2D Echocardiogram has been performed.  Darlina Sicilian M 10/24/2018, 1:41 PM

## 2018-10-24 NOTE — Consult Note (Signed)
Referring Provider: Dr. Pearson GrippeJames Kim  Primary Care Physician:  [facility]:  Dr. Jena Gaussourk (previously unassigned)  Date of Admission: 10/24/18 Date of Consultation: 10/24/18  Reason for Consultation:  Anemia, melena, heme positive stool   HPI:  Edward Fleming is a 68 y.o. year old male presenting to the ED after syncopal episode that occurred s/p sublingual nitro that he had taken due to dyspnea and diaphoresis. Reporting black stool for past several days. Currently taking aspirin and Plavix with history of CAD. Heme positive stool on admission, with Hgb 7.9, normocytic anemia. Unknown baseline, last CBC in Jan 2015 in Epic normal with Hgb 13.3. BUN disproportionately elevated to creatinine: BUN 60, creatinine 1.07.   Per notes, EMS reported SBP of 70 at arrival. SBP now in the 1teens after fluid bolus. Initial high sensitivity troponin negative, now elevated at 31. Carotid ultrasound with minimal heterogeneous and calcified plaque, with no hemodynamically significant stenosis. CXR without active disease. CT head without contrast with no acute finding, remote lacunar infarct at left caudate. D-dimer elevated at 0.64. Rapid testing for COVID negative. Cardiology consulted due to syncope.   Patient notes black stools starting later Saturday and through Sunday. No further black stools since admission. No abdominal pain, no N/V. No unexplained weight loss. Denies chronic GERD. Reports feeling like solid food is "stuck" at times in upper abdomen, present since stents in 2014. Denies any recent NSAID use. However, he took several doses about 6 months ago due to renal stones. Feels fatigued. No shortness of breath now but did note this yesterday with onset of diaphoresis. Vomiting X 1, small amount, described as white phlegm prior to arrival. Had been nauseated on arrival if laying down and then raised back up. No hematemesis.  No prior EGD or colonoscopy. Denying any family history of colon  cancer, colorectal polyps.    Past Medical History:  Diagnosis Date  . CAD (coronary artery disease)    a. Anterolateral STEMI 12/28/12 s/p DES-mid LCx, DES x 2-mid LAD b. Echo 12/29/12: EF 65-70%, PASP 42 mmHg, no WMAs  . Hyperlipidemia   . Hypertension   . Obesity   . Type 2 diabetes mellitus (HCC)    Hgb A1C 6.8% on 12/28/12    Past Surgical History:  Procedure Laterality Date  . CORONARY ANGIOPLASTY WITH STENT PLACEMENT  12/28/2012   20% prox LAD, 99% mid LAD, 90% prox OM1, 100% mid LCx, 30% prox, mid and distal RCA disease; EF 55% with apical hypokinesis  . LEFT HEART CATHETERIZATION WITH CORONARY ANGIOGRAM N/A 12/28/2012   Procedure: LEFT HEART CATHETERIZATION WITH CORONARY ANGIOGRAM;  Surgeon: Kathleene Hazelhristopher D McAlhany, MD;  [facility];  Service: Cardiovascular;  Laterality: N/A;  . thumb surgery Left     Prior to Admission medications   Medication Sig Start Date End Date Taking? Authorizing Provider  amLODipine (NORVASC) 10 MG tablet Take 10 mg by mouth daily.     [provider]  aspirin EC 81 MG tablet Take 81 mg by mouth daily.     [provider]  atorvastatin (LIPITOR) 80 MG tablet Take 80 mg by mouth daily at 6 PM. 12/31/12   Arguello, Pincus Sanesoger A, PA-C  clopidogrel (PLAVIX) 75 MG tablet Take 75 mg by mouth daily with breakfast.    [provider]  glucosamine-chondroitin 500-400 MG tablet Take 2 tablets by mouth at bedtime.    [provider]  metoprolol tartrate (LOPRESSOR) 25 MG tablet Take 1 tablet (25 mg total) by  mouth 2 (two) times daily. 12/31/12   Arguello, Roger A, PA-C  Multiple Vitamin (MULTIVITAMIN WITH MINERALS) TABS tablet Take 1 tablet by mouth at bedtime.    [provider]  nitroGLYCERIN (NITROSTAT) 0.4 MG SL tablet Place 1 tablet (0.4 mg total) under the tongue every 5 (five) minutes x 3 doses as needed for chest pain. 12/31/12   Arguello, Pincus Sanes, PA-C    Current Facility-Administered Medications   Medication Dose Route Frequency Provider Last Rate Last Dose  . 0.9 %  sodium chloride infusion   Intravenous Continuous Pearson Grippe, MD 75 mL/hr at 10/24/18 0759    . acetaminophen (TYLENOL) tablet 650 mg  650 mg Oral Q6H PRN Pearson Grippe, MD       Or  . acetaminophen (TYLENOL) suppository 650 mg  650 mg Rectal Q6H PRN Pearson Grippe, MD      . insulin aspart (novoLOG) injection 0-9 Units  0-9 Units Subcutaneous Q4H Pearson Grippe, MD      . ondansetron [facility]) injection 4 mg  4 mg Intravenous Q6H PRN Pearson Grippe, MD      . pantoprazole (PROTONIX) 80 mg in sodium chloride 0.9 % 250 mL (0.32 mg/mL) infusion  8 mg/hr Intravenous Continuous Gilda Crease, MD 25 mL/hr at 10/24/18 0801 8 mg/hr at 10/24/18 0801   Current Outpatient Medications  Medication Sig Dispense Refill  . amLODipine (NORVASC) 10 MG tablet Take 10 mg by mouth daily.     Marland Kitchen aspirin EC 81 MG tablet Take 81 mg by mouth daily.     Marland Kitchen atorvastatin (LIPITOR) 80 MG tablet Take 80 mg by mouth daily at 6 PM.    . clopidogrel (PLAVIX) 75 MG tablet Take 75 mg by mouth daily with breakfast.    . glucosamine-chondroitin 500-400 MG tablet Take 2 tablets by mouth at bedtime.    . metoprolol tartrate (LOPRESSOR) 25 MG tablet Take 1 tablet (25 mg total) by mouth 2 (two) times daily. 60 tablet 3  . Multiple Vitamin (MULTIVITAMIN WITH MINERALS) TABS tablet Take 1 tablet by mouth at bedtime.    . nitroGLYCERIN (NITROSTAT) 0.4 MG SL tablet Place 1 tablet (0.4 mg total) under the tongue every 5 (five) minutes x 3 doses as needed for chest pain. 25 tablet 3    Allergies as of 10/24/2018 - Review Complete 10/24/2018  Allergen Reaction Noted  . Ticagrelor  12/30/2012    Family History  Problem Relation Age of Onset  . Heart disease Brother   . Cirrhosis Sister        alcohol-related  . Colon cancer Neg Hx   . Colon polyps Neg Hx     Social History   Socioeconomic History  . Marital status: Married    Spouse name: Not on file  . Number  of children: Not on file  . Years of education: Not on file  . Highest education level: Not on file  Occupational History  . Occupation: Chief Operating Officer  Social Needs  . Financial resource strain: Not on file  . Food insecurity    Worry: Not on file    Inability: Not on file  . Transportation needs    Medical: Not on file    Non-medical: Not on file  Tobacco Use  . Smoking status: Former Smoker    Packs/day: 1.00    Years: 40.00    Pack years: 40.00    Types: Cigars    Quit date: 09/28/2012    Years since quitting: 6.0  .  Smokeless tobacco: Never Used  . Tobacco comment: Continues to smoke cigars  Substance and Sexual Activity  . Alcohol use: No    Comment: No abuse  . Drug use: No  . Sexual activity: Not Currently  Lifestyle  . Physical activity    Days per week: Not on file    Minutes per session: Not on file  . Stress: Not on file  Relationships  . Social Musician on phone: Not on file    Gets together: Not on file    Attends religious service: Not on file    Active member of club or organization: Not on file    Attends meetings of clubs or organizations: Not on file    Relationship status: Not on file  . Intimate partner violence    Fear of current or ex partner: Not on file    Emotionally abused: Not on file    Physically abused: Not on file    Forced sexual activity: Not on file  Other Topics Concern  . Not on file  Social History Narrative   Lives with wife.    Review of Systems: Gen: see HPI CV: see HPI  Resp: see HPI GI: see HPI GU : Denies urinary burning, urinary frequency, urinary incontinence.  MS: Denies joint pain,swelling, cramping Derm: Denies rash, itching, dry skin Psych: Denies depression, anxiety,confusion, or memory loss Heme: see HPI  Physical Exam: Vital signs in last 24 hours: Temp:  [97.8 F (36.6 C)] 97.8 F (36.6 C) (09/28 0336) Pulse Rate:  [85-97] 88 (09/28 0800) Resp:  [14-23] 22 (09/28 0730) BP:  (102-114)/(57-96) 106/76 (09/28 0800) SpO2:  [97 %-100 %] 100 % (09/28 0800) Weight:  [86.5 kg] 86.5 kg (09/28 0336)   General:   Alert, pale but well-nourished, pleasant and cooperative in NAD Head:  Normocephalic and atraumatic. Eyes:  Sclera clear, no icterus.   Conjunctiva pink. Ears:  Normal auditory acuity. Lungs:  Clear throughout to auscultation.    Heart:  S1 S2 present without murmurs Abdomen:  Soft, nontender and nondistended. No masses, hepatosplenomegaly or hernias noted. Normal bowel sounds, without guarding, and without rebound.   Rectal:  Deferred  Msk:  Symmetrical without gross deformities. Normal posture. Extremities:  Without edema. Neurologic:  Alert and  oriented x4 Psych:  Alert and cooperative. Normal mood and affect.  Intake/Output from previous day: 09/27 0701 - 09/28 0700 In: -  Out: 300 [Emesis/NG output:300] Intake/Output this shift: Total I/O In: 100 [IV Piggyback:100] Out: -   Lab Results: Recent Labs    10/24/18 0351  WBC 11.8*  HGB 7.9*  HCT 24.7*  PLT 264   BMET Recent Labs    10/24/18 0351  NA 139  K 4.2  CL 106  CO2 24  GLUCOSE 163*  BUN 60*  CREATININE 1.07  CALCIUM 8.0*   LFT Recent Labs    10/24/18 0351  PROT 6.1*  ALBUMIN 3.3*  AST 21  ALT 24  ALKPHOS 60  BILITOT 0.5   Studies/Results: Ct Head Wo Contrast  Result Date: 10/24/2018 CLINICAL DATA:  Syncope and fall. EXAM: CT HEAD WITHOUT CONTRAST TECHNIQUE: Contiguous axial images were obtained from the base of the skull through the vertex without intravenous contrast. COMPARISON:  None. FINDINGS: Brain: No evidence of acute infarction, hemorrhage, hydrocephalus, extra-axial collection or mass lesion/mass effect. Remote lacunar infarct at the left caudate head, see coronal reformats. Vascular: No hyperdense vessel or unexpected calcification. Skull: Normal. Negative for fracture or focal  lesion. Sinuses/Orbits: No acute finding. IMPRESSION: 1. No acute finding. 2.  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 Electronically Signed   By: Monte Fantasia M.D.   On: 10/24/2018 09:20   US Carotid Bilateral  Result Date: 10/24/2018 CLINICAL DATA:  68 year old male with a history of syncope EXAM: BILATERAL CAROTID DUPLEX ULTRASOUND TECHNIQUE: Pearline Cables scale imaging, color Doppler and duplex ultrasound were performed of bilateral carotid and vertebral arteries in the neck. COMPARISON:  None. FINDINGS: Criteria: Quantification of carotid stenosis is based on velocity parameters that correlate the residual internal carotid diameter with NASCET-based stenosis levels, using the diameter of the distal internal carotid lumen as the denominator for stenosis measurement. The following velocity measurements were obtained: RIGHT ICA:  Systolic 161 cm/sec, Diastolic 44 cm/sec CCA:  83 cm/sec SYSTOLIC ICA/CCA RATIO:  1.5 ECA:  118 cm/sec LEFT ICA:  Systolic 78 cm/sec, Diastolic 32 cm/sec CCA:  87 cm/sec SYSTOLIC ICA/CCA RATIO:  0.9 ECA:  80 cm/sec Right Brachial SBP: Not acquired Left Brachial SBP: Not acquired RIGHT CAROTID ARTERY: No significant calcifications of the right common carotid artery. Intermediate waveform maintained. Heterogeneous and partially calcified plaque at the right carotid bifurcation. No significant lumen shadowing. Low resistance waveform of the right ICA. Significant tortuosity. RIGHT VERTEBRAL ARTERY: Antegrade flow with low resistance waveform. LEFT CAROTID ARTERY: No significant calcifications of the left common carotid artery. Intermediate waveform maintained. Heterogeneous and partially calcified plaque at the left carotid bifurcation without significant lumen shadowing. Low resistance waveform of the left ICA. Significant tortuosity. LEFT VERTEBRAL ARTERY:  Antegrade flow with low resistance waveform. IMPRESSION: Color duplex indicates minimal heterogeneous and calcified plaque, with no hemodynamically significant stenosis by duplex criteria in the extracranial  cerebrovascular circulation. Signed, Dulcy Fanny. Dellia Nims, RPVI [facility] Electronically Signed   By: Corrie Mckusick D.O.   On: 10/24/2018 09:55   Dg Chest Port 1 View  Result Date: 10/24/2018 CLINICAL DATA:  Shortness of breath EXAM: PORTABLE CHEST 1 VIEW COMPARISON:  12/29/2012 FINDINGS: Normal heart size and mediastinal contours. There is mild wasting of the trachea at the thoracic inlet that is unchanged, question goiter. No acute infiltrate or edema. No effusion or pneumothorax. No acute osseous findings. IMPRESSION: 1. No evidence of acute disease. 2. Mild tapering of the trachea at the thoracic inlet, stable from 2014. Question goiter-please correlate with neck exam. Electronically Signed   By: Monte Fantasia M.D.   On: 10/24/2018 04:40    Impression: Very pleasant 68 year old male presenting with syncopal episode after taking sublingual nitroglycerin for dyspnea and diaphoretic symptoms, which he states were similar to his symptoms at time of MI in 2014; he was found to have normocytic anemia with Hgb 7.9, heme positive stool, and reported melena for past 48 hours in setting of aspirin and Plavix as outpatient. No overt GI bleeding while in ED. No hematemesis. He denies any chronic GERD symptoms, but he does report a feeling of heaviness and food "sticking" in upper abdomen intermittently since time of MI. No prior EGD. Clinically with UGI bleed and will need diagnostic endoscopic procedure this admission.   Cardiology has been consulted due to syncopal episode, which is multifactorial in this setting. Likely has component of demand ischemia. D-dimer is elevated at 0.64. Discussed with Dr. Carles Collet: per outlined algorithm at time of H&P, will be ordering CTA chest to rule out PE.  At this point, remain NPO and continue supportive measures. Anticipate EGD will be on 9/29, as diagnostic  evaluations till underway. Await cardiology consultation  as well.   Plan: NPO Transfuse as needed Continue with supportive measures Continue PPI infusion Follow CBC Plavix and aspirin on hold Diagnostic EGD in near future, +/- dilation. Discussed risks and benefits with patient, who stated understanding. Will need colonoscopy as outpatient (no prior evaluation)   Gelene MinkAnna W. Dreon Pineda, PhD, ANP-BC [facility]     LOS: 0 days    10/24/2018, 10:41 AM

## 2018-10-24 NOTE — TOC Progression Note (Signed)
Transition of Care [facility]) - Progression Note    Patient Details  Name: Edward Fleming MRN: [id number] Date of Birth: 12/09/1950  Transition of Care [facility]) CM/SW Contact  Boneta Lucks, RN Phone Number: 10/24/2018, 12:37 PM  Clinical Narrative:   [place] Patient in ED for syncope, continued medical work up needed with admission planned.  Called [facility] to get patient registered. Patient was approved, they will email CM the notification #, Gave CM a reference #  [id number].  TOC to follow for needs.       Barriers to Discharge: Continued Medical Work up.

## 2018-10-24 NOTE — ED Triage Notes (Signed)
Pt reports having episode of sweating and shortness of breath while at home, pt says he thought he was having a heart attack, even though he did not have chest pain, so he took 3 nitro because he felt like this the last time he had heart attack. Pt had syncopal episode afterwards and EMS was called. Pt vomited once at home when "sitting up quickly" per EMS. Pt denies SOB or chest pain at this time.

## 2018-10-24 NOTE — Progress Notes (Signed)
PROGRESS NOTE  Edward Fleming YNW:[id number] DOB: 1950-06-29 DOA: 10/24/2018 PCP: System, Pcp Not In  Brief History:  68 year old male with a history of coronary artery disease/STEMI 2014 with DES x2 to the mid LAD, hyperlipidemia, hypertension, diabetes mellitus type 2 presenting with syncope.  The patient woke up around 1 AM on 10/24/2018 because he was having leg cramps.  He describes some shortness of breath and diaphoresis.  He felt like this was similar symptoms as when he had his heart attack in 2014.  He took nitroglycerin shortly after which he syncopized.  He woke up on the floor and did not recall the events.  He denies biting his tongue or urinary or bowel incontinence.  Prior to the above episode, the patient had been in his usual state of health.  He had denied any previous chest discomfort, shortness of breath, coughing, dizziness, or syncope.  There is been no fevers, chills, nausea, vomiting, diarrhea, abdominal pain, dysuria, hematuria.  He endorses compliance with all his medications.  He states that he has been able to perform his usual daily activities without any chest discomfort or shortness of breath. In the emergency department, the patient was afebrile hemodynamically stable saturating 100% room air.  BMP, LFTs were unremarkable.  Hemoglobin was 7.9 which was a significant drop from January 2015 when his hemoglobin was 13.3.  FOBT was positive.  EKG shows sinus rhythm with nonspecific T wave changes.  Assessment/Plan: Syncope -Appears to be vagally mediated versus hypovolemia as this occurred after the patient took nitroglycerin SL -Patient also recently started Flomax 1 to 2 weeks ago -Check orthostatics -Echocardiogram -Monitor on telemetry -Cardiology consult  Acute blood loss anemia/melena -Hemoglobin dropped more than 5 g since 2015 -Continue Protonix -GI consult -Hold Plavix for now pending GI consult  Coronary artery disease history of STEMI  -Personally reviewed EKG--sinus rhythm, nonspecific T wave change -Troponins not consistent with ACS -Cardiology eval  Diabetes mellitus type 2 -Patient not on any agents -NovoLog sliding scale -Hemoglobin A1c  Essential hypertension -Holding amlodipine secondary to soft blood pressure -Continue metoprolol titrate        Disposition Plan:   Home in 2-3 days  Family Communication:   Spouse updated at bedside 9/28  Consultants:  GI, cardiology  Code Status:  FULL  DVT Prophylaxis:  SCDs   Procedures: As Listed in Progress Note Above  Antibiotics: None   Total time spent 35 minutes.  Greater than 50% spent face to face counseling and coordinating care.     Subjective: Patient denies fevers, chills, headache, chest pain, dyspnea, nausea, vomiting, diarrhea, abdominal pain, dysuria, hematuria, hematochezia, and melena.   Objective: Vitals:   10/24/18 0700 10/24/18 0730 10/24/18 0745 10/24/18 0800  BP: (!) 114/96 110/74  106/76  Pulse: 87 88 90 88  Resp: (!) 22 (!) 22    Temp:      TempSrc:      SpO2: 100% 100% 100% 100%  Weight:        Intake/Output Summary (Last 24 hours) at 10/24/2018 0831 Last data filed at 10/24/2018 0329 Gross per 24 hour  Intake -  Output 300 ml  Net -300 ml   Weight change:  Exam:   General:  Pt is alert, follows commands appropriately, not in acute distress  HEENT: No icterus, No thrush, No neck mass, [place]/AT  Cardiovascular: RRR, S1/S2, no rubs, no gallops  Respiratory: CTA bilaterally, no wheezing, no crackles, no rhonchi  Abdomen: Soft/+BS, non tender, non distended, no guarding  Extremities: No edema, No lymphangitis, No petechiae, No rashes, no synovitis   Data Reviewed: I have personally reviewed following labs and imaging studies Basic Metabolic Panel: Recent Labs  Lab 10/24/18 0351  NA 139  K 4.2  CL 106  CO2 24  GLUCOSE 163*  BUN 60*  CREATININE 1.07  CALCIUM 8.0*   Liver Function Tests: Recent  Labs  Lab 10/24/18 0351  AST 21  ALT 24  ALKPHOS 60  BILITOT 0.5  PROT 6.1*  ALBUMIN 3.3*   No results for input(s): LIPASE, AMYLASE in the last 168 hours. No results for input(s): AMMONIA in the last 168 hours. Coagulation Profile: No results for input(s): INR, PROTIME in the last 168 hours. CBC: Recent Labs  Lab 10/24/18 0351  WBC 11.8*  NEUTROABS 9.2*  HGB 7.9*  HCT 24.7*  MCV 96.1  PLT 264   Cardiac Enzymes: No results for input(s): CKTOTAL, CKMB, CKMBINDEX, TROPONINI in the last 168 hours. BNP: Invalid input(s): POCBNP CBG: Recent Labs  Lab 10/24/18 0724  GLUCAP 95   HbA1C: No results for input(s): HGBA1C in the last 72 hours. Urine analysis: No results found for: COLORURINE, APPEARANCEUR, LABSPEC, PHURINE, GLUCOSEU, HGBUR, BILIRUBINUR, KETONESUR, PROTEINUR, UROBILINOGEN, NITRITE, LEUKOCYTESUR Sepsis Labs: @LABRCNTIP (procalcitonin:4,lacticidven:4) )No results found for this or any previous visit (from the past 240 hour(s)).   Scheduled Meds: . insulin aspart  0-9 Units Subcutaneous Q4H   Continuous Infusions: . sodium chloride 75 mL/hr at 10/24/18 0759  . pantoprozole (PROTONIX) infusion 8 mg/hr (10/24/18 0801)    Procedures/Studies: Dg Chest Port 1 View  Result Date: 10/24/2018 CLINICAL DATA:  Shortness of breath EXAM: PORTABLE CHEST 1 VIEW COMPARISON:  12/29/2012 FINDINGS: Normal heart size and mediastinal contours. There is mild wasting of the trachea at the thoracic inlet that is unchanged, question goiter. No acute infiltrate or edema. No effusion or pneumothorax. No acute osseous findings. IMPRESSION: 1. No evidence of acute disease. 2. Mild tapering of the trachea at the thoracic inlet, stable from 2014. Question goiter-please correlate with neck exam. Electronically Signed   By: Monte Fantasia M.D.   On: 10/24/2018 04:40    Orson Eva, DO  [facility] Pager [phone]  If 7PM-7AM, please contact night-coverage [email or web address] Password  [facility] 10/24/2018, 8:31 AM   LOS: 0 days

## 2018-10-24 NOTE — ED Notes (Signed)
Patient transported to Ultrasound 

## 2018-10-24 NOTE — ED Provider Notes (Signed)
[facility] EMERGENCY DEPARTMENT Provider Note   CSN: [id number] Arrival date & time: 10/24/18  0316     History   Chief Complaint Chief Complaint  Patient presents with  . Loss of Consciousness    HPI Edward Fleming is a 68 y.o. male.     Patient presents to the emergency department for evaluation after a syncopal episode.  Patient reports that he did start feeling mild shortness of breath earlier tonight.  He then started to feel sweaty.  He became concerned because he has a history of STEMI and with that presentation he had shortness of breath and diaphoresis, but no chest pain.  Patient therefore took sublingual nitroglycerin.  He did not feel improvement of his difficulty breathing, however started to feel weak, dizzy and then woke up on the floor.  Patient's wife called EMS.  EMS report that he had a blood pressure of 70 upon their arrival.  Patient has been administered 1 L of IV fluids by bolus during transport.  Patient still feels weak, dizzy and gets nauseated whenever he sits up.     Past Medical History:  Diagnosis Date  . CAD (coronary artery disease)    a. Anterolateral STEMI 12/28/12 s/p DES-mid LCx, DES x 2-mid LAD b. Echo 12/29/12: EF 65-70%, PASP 42 mmHg, no WMAs  . Hyperlipidemia   . Hypertension   . Obesity   . Type 2 diabetes mellitus (HCC)    Hgb A1C 6.8% on 12/28/12    Patient Active Problem List   Diagnosis Date Noted  . ST elevation myocardial infarction (STEMI) of anterolateral wall ([place]) 12/31/2012  . Diabetes mellitus, new onset ([place]) 12/31/2012  . Elevated LFTs 12/31/2012  . Obesity 12/31/2012  . Hypokalemia 12/31/2012  . Hyperlipidemia 12/31/2012  . Leukocytosis 12/31/2012  . Normocytic anemia 12/31/2012  . CAD (coronary artery disease), native coronary artery 12/31/2012  . Hypertension     Past Surgical History:  Procedure Laterality Date  . CORONARY ANGIOPLASTY WITH STENT PLACEMENT  12/28/2012   20% prox LAD, 99% mid LAD, 90% prox OM1,  100% mid LCx, 30% prox, mid and distal RCA disease; EF 55% with apical hypokinesis  . HAND SURGERY Left   . LEFT HEART CATHETERIZATION WITH CORONARY ANGIOGRAM N/A 12/28/2012   Procedure: LEFT HEART CATHETERIZATION WITH CORONARY ANGIOGRAM;  Surgeon: Burnell Blanks, MD;  Location: [facility] CATH LAB;  Service: Cardiovascular;  Laterality: N/A;        Home Medications    Prior to Admission medications   Medication Sig Start Date End Date Taking? Authorizing Provider  amLODipine (NORVASC) 10 MG tablet Take 10 mg by mouth daily.     [provider]  aspirin EC 81 MG tablet Take 81 mg by mouth daily.     [provider]  atorvastatin (LIPITOR) 80 MG tablet Take 80 mg by mouth daily at 6 PM. 12/31/12   Arguello, Lyda Perone, PA-C  clopidogrel (PLAVIX) 75 MG tablet Take 75 mg by mouth daily with breakfast.    [provider]  glucosamine-chondroitin 500-400 MG tablet Take 2 tablets by mouth at bedtime.    [provider]  metoprolol tartrate (LOPRESSOR) 25 MG tablet Take 1 tablet (25 mg total) by mouth 2 (two) times daily. 12/31/12   Arguello, Roger A, PA-C  Multiple Vitamin (MULTIVITAMIN WITH MINERALS) TABS tablet Take 1 tablet by mouth at bedtime.    [provider]  nitroGLYCERIN (NITROSTAT) 0.4 MG SL tablet Place 1 tablet (0.4 mg total) under the  tongue every 5 (five) minutes x 3 doses as needed for chest pain. 12/31/12   Arguello, Pincus Sanesoger A, PA-C    Family History Family History  Problem Relation Age of Onset  . Heart disease Brother     Social History Social History   Tobacco Use  . Smoking status: Former Smoker    Packs/day: 1.00    Years: 40.00    Pack years: 40.00    Quit date: 09/28/2012    Years since quitting: 6.0  . Smokeless tobacco: Never Used  Substance Use Topics  . Alcohol use: No    Comment: No abuse  . Drug use: No     Allergies   Ticagrelor   Review of Systems Review of Systems  Constitutional: Positive for  diaphoresis.  Respiratory: Positive for shortness of breath.   Gastrointestinal: Positive for nausea and vomiting.  Neurological: Positive for dizziness and syncope.  All other systems reviewed and are negative.    Physical Exam Updated Vital Signs BP 111/73   Pulse 88   Temp 97.8 F (36.6 C) (Oral)   Resp (!) 23   Wt 86.5 kg   SpO2 100%   BMI 33.78 kg/m   Physical Exam Vitals signs and nursing note reviewed.  Constitutional:      General: He is in acute distress.     Appearance: Normal appearance. He is well-developed.  HENT:     Head: Normocephalic and atraumatic.     Right Ear: Hearing normal.     Left Ear: Hearing normal.     Nose: Nose normal.  Eyes:     Conjunctiva/sclera: Conjunctivae normal.     Pupils: Pupils are equal, round, and reactive to light.  Neck:     Musculoskeletal: Normal range of motion and neck supple.  Cardiovascular:     Rate and Rhythm: Regular rhythm.     Heart sounds: S1 normal and S2 normal. No murmur. No friction rub. No gallop.   Pulmonary:     Effort: Pulmonary effort is normal. No respiratory distress.     Breath sounds: Normal breath sounds.  Chest:     Chest wall: No tenderness.  Abdominal:     General: Bowel sounds are normal.     Palpations: Abdomen is soft.     Tenderness: There is no abdominal tenderness. There is no guarding or rebound. Negative signs include Murphy's sign and McBurney's sign.     Hernia: No hernia is present.  Musculoskeletal: Normal range of motion.  Skin:    General: Skin is warm and dry.     Findings: No rash.  Neurological:     Mental Status: He is alert and oriented to person, place, and time.     GCS: GCS eye subscore is 4. GCS verbal subscore is 5. GCS motor subscore is 6.     Cranial Nerves: No cranial nerve deficit.     Sensory: No sensory deficit.     Coordination: Coordination normal.  Psychiatric:        Speech: Speech normal.        Behavior: Behavior normal.        Thought Content:  Thought content normal.      ED Treatments / Results  Labs (all labs ordered are listed, but only abnormal results are displayed) Labs Reviewed  CBC WITH DIFFERENTIAL/PLATELET - Abnormal; Notable for the following components:      Result Value   WBC 11.8 (*)    RBC 2.57 (*)    Hemoglobin  7.9 (*)    HCT 24.7 (*)    Neutro Abs 9.2 (*)    All other components within normal limits  COMPREHENSIVE METABOLIC PANEL - Abnormal; Notable for the following components:   Glucose, Bld 163 (*)    BUN 60 (*)    Calcium 8.0 (*)    Total Protein 6.1 (*)    Albumin 3.3 (*)    All other components within normal limits  POC OCCULT BLOOD, ED - Abnormal; Notable for the following components:   Fecal Occult Bld POSITIVE (*)    All other components within normal limits  SARS CORONAVIRUS 2 (HOSPITAL ORDER, PERFORMED IN [facility])  BRAIN NATRIURETIC PEPTIDE  TROPONIN I (HIGH SENSITIVITY)  TROPONIN I (HIGH SENSITIVITY)    EKG EKG Interpretation  Date/Time:  Monday October 24 2018 03:28:08 EDT Ventricular Rate:  97 PR Interval:    QRS Duration: 112 QT Interval:  367 QTC Calculation: 467 R Axis:   76 Text Interpretation:  Sinus rhythm Inferior infarct, age indeterminate Abnormal lateral Q waves Baseline wander in lead(s) V1 No significant change since last tracing Confirmed by Gilda Crease ([id number]) on 10/24/2018 3:34:09 AM   Radiology Dg Chest Port 1 View  Result Date: 10/24/2018 CLINICAL DATA:  Shortness of breath EXAM: PORTABLE CHEST 1 VIEW COMPARISON:  12/29/2012 FINDINGS: Normal heart size and mediastinal contours. There is mild wasting of the trachea at the thoracic inlet that is unchanged, question goiter. No acute infiltrate or edema. No effusion or pneumothorax. No acute osseous findings. IMPRESSION: 1. No evidence of acute disease. 2. Mild tapering of the trachea at the thoracic inlet, stable from 2014. Question goiter-please correlate with neck exam.  Electronically Signed   By: Marnee Spring M.D.   On: 10/24/2018 04:40    Procedures Procedures (including critical care time)  Medications Ordered in ED Medications  pantoprazole (PROTONIX) 80 mg in sodium chloride 0.9 % 100 mL IVPB (has no administration in time range)  pantoprazole (PROTONIX) 80 mg in sodium chloride 0.9 % 250 mL (0.32 mg/mL) infusion (has no administration in time range)  pantoprazole (PROTONIX) injection 40 mg (has no administration in time range)     Initial Impression / Assessment and Plan / ED Course  I have reviewed the triage vital signs and the nursing notes.  Pertinent labs & imaging results that were available during my care of the patient were reviewed by me and considered in my medical decision making (see chart for details).        Patient presents to the emergency department for evaluation of syncope.  Patient reports that he has not felt well for the last couple of days.  He has had some progressive shortness of breath.  Tonight he got diaphoretic and thought he might be having a heart attack because those were symptoms that he had with his previous MI.  He did not have associated chest pain but he did not with his MI either.  Patient took nitroglycerin and then passed out.  EMS report hypotension during transport which improved with IV fluids.  EKG does not suggest ischemia or infarct.  Initial troponin negative.  No history of congestive heart failure and no signs of volume overload at this time.  Patient found to be anemic with a hemoglobin of 7.9, baseline appears to be between 13 and 15 for him.  He also has a very elevated BUN with a normal creatinine suggestive of upper GI bleed.  Rectal exam reveals melanotic stools  that are heme positive.  Patient now tells me that he has been having these dark and black stools for the last 2 or 3 days.  He takes aspirin 81 mg and Plavix because of previous stenting.  He has never had an ulcer and denies any upper  abdominal discomfort.  No hematemesis.  Patient placed on Protonix bolus and drip and will be admitted for further work-up.  Discussed with Dr. Jena Gauss, on-call for gastroenterology.  Patient will be seen in consultation.  Final Clinical Impressions(s) / ED Diagnoses   Final diagnoses:  Syncope and collapse  Upper GI bleed    ED Discharge Orders    None       Gilda Crease, MD 10/24/18 [phone]

## 2018-10-24 NOTE — H&P (Signed)
TRH H&P    Patient Demographics:    Edward Fleming, is a 68 y.o. male  MRN: [id number]  DOB - [birth year]  Admit Date - 10/24/2018  Referring MD/NP/PA: Scot Jun  Outpatient Primary MD for the patient is [facility], [place] [facility]   Patient coming from:  home  Chief complaint- syncope   HPI:    Edward Fleming  is a 68 y.o. male,  w hypertension, hyperlipidemia, Dm2, CAD s/p STEMI 12/28/2012 s/p DES mid Lcx, DES x2 mid LAD, w c/o syncope last nite. Pt notes that he took slg nitro prior to syncope.  Pt had some dyspnea and diaphoresis last nite and these were similar symptoms to what he had when he had his heart attack and therefore took nitro.  Pt notes black loose stool for the past 2 days.  Pt denies nsaid use.  Pt takes aspirin and plavix for his CAD.  Pt denies any prior endoscopy,  Pt is [facility] at the [facility].   In ED,  Temperature 97.8 pulse 95 respiratory rate 14 blood pressure 107/65 pulse ox 98% on room air weight 86.5 kg  CXR IMPRESSION: 1. No evidence of acute disease. 2. Mild tapering of the trachea at the thoracic inlet, stable from 2014. Question goiter-please correlate with neck exam.  WBC 11.8, hemoglobin 7.9 platelet count 264 Na 139, potassium 4.2, BUN 60, creatinine 1.07  AST 21, Alt 24 Trop 16-> 31   BNP 27  FOBT +  Pt will be admitted for syncope, anemia, r/o UGI bleeding (BUN elevated and pt anemic, with black stool)   Review of systems:    In addition to the HPI above,  No Fever-chills, No Headache, No changes with Vision or hearing, No problems swallowing food or Liquids, No Chest pain, Cough or Shortness of Breath, No Abdominal pain, No Nausea or Vomiting,   No Blood in Urine, no brbpr No dysuria, No new skin rashes or bruises, No new joints pains-aches,  No new weakness, tingling, numbness in any extremity, No recent weight gain or loss, No polyuria, polydypsia  or polyphagia, No significant Mental Stressors.  All other systems reviewed and are negative.    Past History of the following :    Past Medical History:  Diagnosis Date  . CAD (coronary artery disease)    a. Anterolateral STEMI 12/28/12 s/p DES-mid LCx, DES x 2-mid LAD b. Echo 12/29/12: EF 65-70%, PASP 42 mmHg, no WMAs  . Hyperlipidemia   . Hypertension   . Obesity   . Type 2 diabetes mellitus (HCC)    Hgb A1C 6.8% on 12/28/12      Past Surgical History:  Procedure Laterality Date  . CORONARY ANGIOPLASTY WITH STENT PLACEMENT  12/28/2012   20% prox LAD, 99% mid LAD, 90% prox OM1, 100% mid LCx, 30% prox, mid and distal RCA disease; EF 55% with apical hypokinesis  . HAND SURGERY Left   . LEFT HEART CATHETERIZATION WITH CORONARY ANGIOGRAM N/A 12/28/2012   Procedure: LEFT HEART CATHETERIZATION WITH CORONARY ANGIOGRAM;  Surgeon: Burnell Blanks,  MD;  Location: [facility]ATH [facility];  Service: Cardiovascular;  Laterality: N/A;      Social History:      Social History   Tobacco Use  . Smoking status: Former Smoker    Packs/day: 1.00    Years: 40.00    Pack years: 40.00    Quit date: 09/28/2012    Years since quitting: 6.0  . Smokeless tobacco: Never Used  Substance Use Topics  . Alcohol use: No    Comment: No abuse       Family History :     Family History  Problem Relation Age of Onset  . Heart disease Brother        Home Medications:   Prior to Admission medications   Medication Sig Start Date End Date Taking? Authorizing Provider  amLODipine (NORVASC) 10 MG tablet Take 10 mg by mouth daily.     [provider]  aspirin EC 81 MG tablet Take 81 mg by mouth daily.     [provider]  atorvastatin (LIPITOR) 80 MG tablet Take 80 mg by mouth daily at 6 PM. 12/31/12   Arguello, Lyda Perone, PA-C  clopidogrel (PLAVIX) 75 MG tablet Take 75 mg by mouth daily with breakfast.    [provider]  glucosamine-chondroitin 500-400 MG tablet Take 2 tablets  by mouth at bedtime.    [provider]  metoprolol tartrate (LOPRESSOR) 25 MG tablet Take 1 tablet (25 mg total) by mouth 2 (two) times daily. 12/31/12   Arguello, Roger A, PA-C  Multiple Vitamin (MULTIVITAMIN WITH MINERALS) TABS tablet Take 1 tablet by mouth at bedtime.    [provider]  nitroGLYCERIN (NITROSTAT) 0.4 MG SL tablet Place 1 tablet (0.4 mg total) under the tongue every 5 (five) minutes x 3 doses as needed for chest pain. 12/31/12   Arguello, Lyda Perone, PA-C     Allergies:     Allergies  Allergen Reactions  . Ticagrelor     Possible cause of shortness of breath     Physical Exam:   Vitals  Blood pressure (!) 114/96, pulse 87, temperature 97.8 F (36.6 C), temperature source Oral, resp. rate (!) 22, weight 86.5 kg, SpO2 100 %.  1.  General: Alert and oriented x3  2. Psychiatric: Euthymic  3. Neurologic: Cranial nerves II through XII intact, reflexes 2+, symmetric, diffuse with no clonus, motor 5/5 in all 4 extremities  4. HEENMT:  Anicteric, pale conjunctiva, pupils 1.5 mm, symmetric, direct, consensual, near reflexes intact Neck no JVD  5. Respiratory : Clear to auscultation bilaterally  6. Cardiovascular : Regular rate rhythm S1-S2 no murmurs gallops or rubs  7. Gastrointestinal:  Abdomen: Soft nontender nondistended positive bowel sounds  8. Skin:  Extremities: No cyanosis clubbing or edema  9.Musculoskeletal:  Good range of motion    Data Review:    CBC Recent Labs  Lab 10/24/18 0351  WBC 11.8*  HGB 7.9*  HCT 24.7*  PLT 264  MCV 96.1  MCH 30.7  MCHC 32.0  RDW 14.8  LYMPHSABS 1.6  MONOABS 0.8  EOSABS 0.1  BASOSABS 0.1   ------------------------------------------------------------------------------------------------------------------  Results for orders placed or performed during the hospital encounter of 10/24/18 (from the past 48 hour(s))  CBC with Differential/Platelet     Status: Abnormal   Collection Time:  10/24/18  3:51 AM  Result Value Ref Range   WBC 11.8 (H) 4.0 - 10.5 K/uL   RBC 2.57 (L) 4.22 - 5.81 MIL/uL   Hemoglobin 7.9 (L)  13.0 - 17.0 g/dL   HCT 24.7 (L) 39.0 - 52.0 %   MCV 96.1 80.0 - 100.0 fL   MCH 30.7 26.0 - 34.0 pg   MCHC 32.0 30.0 - 36.0 g/dL   RDW 14.8 11.5 - 15.5 %   Platelets 264 150 - 400 K/uL   nRBC 0.0 0.0 - 0.2 %   Neutrophils Relative % 77 %   Neutro Abs 9.2 (H) 1.7 - 7.7 K/uL   Lymphocytes Relative 14 %   Lymphs Abs 1.6 0.7 - 4.0 K/uL   Monocytes Relative 7 %   Monocytes Absolute 0.8 0.1 - 1.0 K/uL   Eosinophils Relative 1 %   Eosinophils Absolute 0.1 0.0 - 0.5 K/uL   Basophils Relative 1 %   Basophils Absolute 0.1 0.0 - 0.1 K/uL   Immature Granulocytes 0 %   Abs Immature Granulocytes 0.05 0.00 - 0.07 K/uL    Comment: Performed at [facility], [address], [place], [place] [address]  Comprehensive metabolic panel     Status: Abnormal   Collection Time: 10/24/18  3:51 AM  Result Value Ref Range   Sodium 139 135 - 145 mmol/L   Potassium 4.2 3.5 - 5.1 mmol/L   Chloride 106 98 - 111 mmol/L   CO2 24 22 - 32 mmol/L   Glucose, Bld 163 (H) 70 - 99 mg/dL   BUN 60 (H) 8 - 23 mg/dL   Creatinine, Ser 1.07 0.61 - 1.24 mg/dL   Calcium 8.0 (L) 8.9 - 10.3 mg/dL   Total Protein 6.1 (L) 6.5 - 8.1 g/dL   Albumin 3.3 (L) 3.5 - 5.0 g/dL   AST 21 15 - 41 U/L   ALT 24 0 - 44 U/L   Alkaline Phosphatase 60 38 - 126 U/L   Total Bilirubin 0.5 0.3 - 1.2 mg/dL   GFR calc non Af Amer >60 >60 mL/min   GFR calc Af Amer >60 >60 mL/min   Anion gap 9 5 - 15    Comment: Performed at [facility], [address], [place], [place] [address]  Troponin I (High Sensitivity)     Status: None   Collection Time: 10/24/18  3:51 AM  Result Value Ref Range   Troponin I (High Sensitivity) 16 <18 ng/L    Comment: (NOTE) Elevated high sensitivity troponin I (hsTnI) values and significant  changes across serial measurements may suggest ACS but many other  chronic and acute conditions are  known to elevate hsTnI results.  Refer to the "Links" section for chest pain algorithms and additional  guidance. Performed at [facility], [address], [place], [place] [address]   Brain natriuretic peptide     Status: None   Collection Time: 10/24/18  3:51 AM  Result Value Ref Range   B Natriuretic Peptide 27.0 0.0 - 100.0 pg/mL    Comment: Performed at [facility], [address] [place], [place] [address]  Troponin I (High Sensitivity)     Status: Abnormal   Collection Time: 10/24/18  5:55 AM  Result Value Ref Range   Troponin I (High Sensitivity) 31 (H) <18 ng/L    Comment: (NOTE) Elevated high sensitivity troponin I (hsTnI) values and significant  changes across serial measurements may suggest ACS but many other  chronic and acute conditions are known to elevate hsTnI results.  Refer to the "Links" section for chest pain algorithms and additional  guidance. Performed at [facility], [address] [place], [place] [address]   POC occult blood,  ED RN will collect     Status: Abnormal   Collection Time: 10/24/18  6:09 AM  Result Value Ref Range   Fecal Occult Bld POSITIVE (A) NEGATIVE    Chemistries  Recent Labs  Lab 10/24/18 0351  NA 139  K 4.2  CL 106  CO2 24  GLUCOSE 163*  BUN 60*  CREATININE 1.07  CALCIUM 8.0*  AST 21  ALT 24  ALKPHOS 60  BILITOT 0.5   ------------------------------------------------------------------------------------------------------------------  ------------------------------------------------------------------------------------------------------------------ GFR: CrCl cannot be calculated (Unknown ideal weight.). Liver Function Tests: Recent Labs  Lab 10/24/18 0351  AST 21  ALT 24  ALKPHOS 60  BILITOT 0.5  PROT 6.1*  ALBUMIN 3.3*   No results for input(s): LIPASE, AMYLASE in the last 168 hours. No results for input(s): AMMONIA in the last 168 hours. Coagulation Profile: No results for input(s): INR, PROTIME in the last  168 hours. Cardiac Enzymes: No results for input(s): CKTOTAL, CKMB, CKMBINDEX, TROPONINI in the last 168 hours. BNP (last 3 results) No results for input(s): PROBNP in the last 8760 hours. HbA1C: No results for input(s): HGBA1C in the last 72 hours. CBG: No results for input(s): GLUCAP in the last 168 hours. Lipid Profile: No results for input(s): CHOL, HDL, LDLCALC, TRIG, CHOLHDL, LDLDIRECT in the last 72 hours. Thyroid Function Tests: No results for input(s): TSH, T4TOTAL, FREET4, T3FREE, THYROIDAB in the last 72 hours. Anemia Panel: No results for input(s): VITAMINB12, FOLATE, FERRITIN, TIBC, IRON, RETICCTPCT in the last 72 hours.  --------------------------------------------------------------------------------------------------------------- Urine analysis: No results found for: COLORURINE, APPEARANCEUR, LABSPEC, PHURINE, GLUCOSEU, HGBUR, BILIRUBINUR, KETONESUR, PROTEINUR, UROBILINOGEN, NITRITE, LEUKOCYTESUR    Imaging Results:    Dg Chest Port 1 View  Result Date: 10/24/2018 CLINICAL DATA:  Shortness of breath EXAM: PORTABLE CHEST 1 VIEW COMPARISON:  12/29/2012 FINDINGS: Normal heart size and mediastinal contours. There is mild wasting of the trachea at the thoracic inlet that is unchanged, question goiter. No acute infiltrate or edema. No effusion or pneumothorax. No acute osseous findings. IMPRESSION: 1. No evidence of acute disease. 2. Mild tapering of the trachea at the thoracic inlet, stable from 2014. Question goiter-please correlate with neck exam. Electronically Signed   By: Monte Fantasia M.D.   On: 10/24/2018 04:40   EKG: Normal sinus rhythm at 100, normal axis, no ST, T-segment changes consistent with acute ischemia   Assessment & Plan:    Principal Problem:   Syncope Active Problems:   Hypertension   Diabetes mellitus, new onset ([place])   Hyperlipidemia   CAD (coronary artery disease), native coronary artery   Black tarry stools  Syncope Tele Trop I  Check CT  brain Check carotid ultrasound Check cardiac echo Check D dimer, if elevated CTA chest r/o PE  Elevated troponin likely demand ischemia Check another trop I Check cardiac echo as above Consider cardiology consult if trop increasing significantly. .  Anemia, Black tarry stool, ? UGI bleeding Check ferritin, iron, tibc, b12, folate, esr NPO except for medication Check cbc at noon Check cbc in am tomorrow GI consult requested in computer  Hypertension, Hyperlipidemia, CAD   Cont Lipitor 32m po qhs Hold Norvasc Cont Metoprolol with holding parameters STOP aspirin and plavix due to concern for UGI bleeding  Dm2 fsbs q4h, ISS     DVT Prophylaxis-   SCDs  AM Labs Ordered, also please review Full Orders  Family Communication: Admission, patients condition and plan of care including tests being ordered have been discussed with the patient  who indicate understanding  and agree with the plan and Code Status.  Code Status:  FULL CODE per patient, attempted to notify wife, but no one picking up, left message patient admitted to [facility]  Admission status: Inpatient: Based on patients clinical presentation and evaluation of above clinical data, I have made determination that patient meets Inpatient criteria at this time. Pt has possible ugi bleeding and is at high risk for clinical deterioration due to hx of CAD,  Pt wil require GI evaluation and also close monitoring of his heart.  Pt will require > 2nites stay.  Time spent in minutes : 70   Jani Gravel M.D on 10/24/2018 at 7:14 AM

## 2018-10-25 ENCOUNTER — Inpatient Hospital Stay (HOSPITAL_COMMUNITY)
Admit: 2018-10-25 | Discharge: 2018-10-25 | Disposition: A | Discharge status: Home / Self Care | Payer: Medicare HMO | Attending: Internal Medicine | Admitting: Internal Medicine

## 2018-10-25 ENCOUNTER — Encounter (HOSPITAL_COMMUNITY)
Admission: EM | Disposition: A | Discharge status: Home / Self Care | Payer: Self-pay | Source: Home / Self Care | Attending: Internal Medicine

## 2018-10-25 ENCOUNTER — Encounter (HOSPITAL_COMMUNITY): Payer: Self-pay | Admitting: *Deleted

## 2018-10-25 ENCOUNTER — Inpatient Hospital Stay (HOSPITAL_COMMUNITY): Payer: Medicare HMO | Admitting: Anesthesiology

## 2018-10-25 DIAGNOSIS — K922 Gastrointestinal hemorrhage, unspecified: Secondary | ICD-10-CM

## 2018-10-25 DIAGNOSIS — R55 Syncope and collapse: Secondary | ICD-10-CM

## 2018-10-25 HISTORY — PX: ESOPHAGOGASTRODUODENOSCOPY (EGD) WITH PROPOFOL: SHX5813

## 2018-10-25 HISTORY — PX: BIOPSY: SHX5522

## 2018-10-25 LAB — GLUCOSE, CAPILLARY
Glucose-Capillary: 102 mg/dL — ABNORMAL HIGH (ref 70–99)
Glucose-Capillary: 107 mg/dL — ABNORMAL HIGH (ref 70–99)
Glucose-Capillary: 144 mg/dL — ABNORMAL HIGH (ref 70–99)
Glucose-Capillary: 111 mg/dL — ABNORMAL HIGH (ref 70–99)
Glucose-Capillary: 110 mg/dL — ABNORMAL HIGH (ref 70–99)
Glucose-Capillary: 173 mg/dL — ABNORMAL HIGH (ref 70–99)

## 2018-10-25 LAB — COMPREHENSIVE METABOLIC PANEL
ALT: 18 U/L (ref 0–44)
AST: 25 U/L (ref 15–41)
Albumin: 3.3 g/dL — ABNORMAL LOW (ref 3.5–5.0)
Alkaline Phosphatase: 52 U/L (ref 38–126)
Anion gap: 10 (ref 5–15)
BUN: 25 mg/dL — ABNORMAL HIGH (ref 8–23)
CO2: 23 mmol/L (ref 22–32)
Calcium: 8.4 mg/dL — ABNORMAL LOW (ref 8.9–10.3)
Chloride: 106 mmol/L (ref 98–111)
Creatinine, Ser: 0.9 mg/dL (ref 0.61–1.24)
GFR calc Af Amer: >60 mL/min (ref >60)
GFR calc non Af Amer: >60 mL/min (ref >60)
Glucose, Bld: 111 mg/dL — ABNORMAL HIGH (ref 70–99)
Potassium: 3.9 mmol/L (ref 3.5–5.1)
Sodium: 139 mmol/L (ref 135–145)
Total Bilirubin: 0.7 mg/dL (ref 0.3–1.2)
Total Protein: 5.7 g/dL — ABNORMAL LOW (ref 6.5–8.1)

## 2018-10-25 LAB — CBC
HCT: 20.7 % — ABNORMAL LOW (ref 39.0–52.0)
Hemoglobin: 6.3 g/dL — CL (ref 13.0–17.0)
MCH: 29.2 pg (ref 26.0–34.0)
MCHC: 30.4 g/dL (ref 30.0–36.0)
MCV: 95.8 fL (ref 80.0–100.0)
Platelets: 210 10*3/uL (ref 150–400)
RBC: 2.16 MIL/uL — ABNORMAL LOW (ref 4.22–5.81)
RDW: 15.5 % (ref 11.5–15.5)
WBC: 8.2 10*3/uL (ref 4.0–10.5)
nRBC: 0 % (ref 0.0–0.2)

## 2018-10-25 LAB — PREPARE RBC (CROSSMATCH)

## 2018-10-25 LAB — ABO/RH: ABO/RH(D): B POS

## 2018-10-25 LAB — FOLATE RBC
Folate, Hemolysate: 333 ng/mL
Folate, RBC: 1640 ng/mL (ref >498)
Hematocrit: 20.3 % — ABNORMAL LOW (ref 37.5–51.0)

## 2018-10-25 SURGERY — ESOPHAGOGASTRODUODENOSCOPY (EGD) WITH PROPOFOL
Anesthesia: General

## 2018-10-25 MED ORDER — LIDOCAINE VISCOUS HCL 2 % MT SOLN
OROMUCOSAL | Status: AC
  Filled 2018-10-25: qty 15

## 2018-10-25 MED ORDER — PROPOFOL 500 MG/50ML IV EMUL
INTRAVENOUS | Status: DC | PRN
  Administered 2018-10-25: 150 ug/kg/min via INTRAVENOUS

## 2018-10-25 MED ORDER — CLOPIDOGREL BISULFATE 75 MG PO TABS
75.0000 mg | ORAL_TABLET | Freq: Every day | ORAL | Status: DC
  Administered 2018-10-26: 75 mg via ORAL
  Filled 2018-10-25 (×2): qty 1

## 2018-10-25 MED ORDER — ASPIRIN EC 81 MG PO TBEC
81.0000 mg | DELAYED_RELEASE_TABLET | Freq: Every day | ORAL | Status: DC
  Administered 2018-10-26: 09:00:00 81 mg via ORAL
  Filled 2018-10-25 (×2): qty 1

## 2018-10-25 MED ORDER — SODIUM CHLORIDE 0.9 % IV SOLN: INTRAVENOUS | Status: DC

## 2018-10-25 MED ORDER — GLYCOPYRROLATE 0.2 MG/ML IJ SOLN
INTRAMUSCULAR | Status: AC
  Filled 2018-10-25: qty 1

## 2018-10-25 MED ORDER — GLYCOPYRROLATE 0.2 MG/ML IJ SOLN
INTRAMUSCULAR | Status: DC | PRN
  Administered 2018-10-25: 0.2 mg via INTRAVENOUS

## 2018-10-25 MED ORDER — MIDAZOLAM HCL 2 MG/2ML IJ SOLN: 0.5000 mg | Freq: Once | INTRAMUSCULAR | Status: DC | PRN

## 2018-10-25 MED ORDER — METOPROLOL TARTRATE 25 MG PO TABS
12.5000 mg | ORAL_TABLET | Freq: Two times a day (BID) | ORAL | Status: DC
  Administered 2018-10-25 – 2018-10-26 (×3): 12.5 mg via ORAL
  Filled 2018-10-25 (×3): qty 1

## 2018-10-25 MED ORDER — INSULIN ASPART 100 UNIT/ML ~~LOC~~ SOLN: 0.0000 [IU] | Freq: Three times a day (TID) | SUBCUTANEOUS | Status: DC

## 2018-10-25 MED ORDER — PROMETHAZINE HCL 25 MG/ML IJ SOLN: 6.2500 mg | INTRAMUSCULAR | Status: DC | PRN

## 2018-10-25 MED ORDER — LIDOCAINE HCL (CARDIAC) PF 100 MG/5ML IV SOSY
PREFILLED_SYRINGE | INTRAVENOUS | Status: DC | PRN
  Administered 2018-10-25: 30 mg via INTRAVENOUS

## 2018-10-25 MED ORDER — VITAMIN B-12 1000 MCG PO TABS
500.0000 ug | ORAL_TABLET | Freq: Every day | ORAL | Status: DC
  Administered 2018-10-26: 500 ug via ORAL
  Filled 2018-10-25: qty 1

## 2018-10-25 MED ORDER — SODIUM CHLORIDE 0.9% IV SOLUTION: Freq: Once | INTRAVENOUS | Status: DC

## 2018-10-25 MED ORDER — CYANOCOBALAMIN 1000 MCG/ML IJ SOLN
1000.0000 ug | Freq: Once | INTRAMUSCULAR | Status: AC
  Administered 2018-10-25: 11:00:00 1000 ug via INTRAMUSCULAR
  Filled 2018-10-25: qty 1

## 2018-10-25 MED ORDER — LACTATED RINGERS IV SOLN
INTRAVENOUS | Status: DC
  Administered 2018-10-25: 1000 mL via INTRAVENOUS

## 2018-10-25 MED ORDER — KETAMINE HCL 10 MG/ML IJ SOLN
INTRAMUSCULAR | Status: DC | PRN
  Administered 2018-10-25: 20 mg via INTRAVENOUS

## 2018-10-25 MED ORDER — LIDOCAINE VISCOUS HCL 2 % MT SOLN
OROMUCOSAL | Status: DC | PRN
  Administered 2018-10-25: 1 via OROMUCOSAL

## 2018-10-25 MED ORDER — HYDROCODONE-ACETAMINOPHEN 7.5-325 MG PO TABS: 1.0000 | ORAL_TABLET | Freq: Once | ORAL | Status: DC | PRN

## 2018-10-25 MED ORDER — PANTOPRAZOLE SODIUM 40 MG PO TBEC
40.0000 mg | DELAYED_RELEASE_TABLET | Freq: Two times a day (BID) | ORAL | Status: DC
  Administered 2018-10-25 – 2018-10-26 (×2): 40 mg via ORAL
  Filled 2018-10-25 (×2): qty 1

## 2018-10-25 MED ORDER — PROPOFOL 10 MG/ML IV BOLUS
INTRAVENOUS | Status: DC | PRN
  Administered 2018-10-25: 20 mg via INTRAVENOUS

## 2018-10-25 MED ORDER — KETAMINE HCL 50 MG/5ML IJ SOSY
PREFILLED_SYRINGE | INTRAMUSCULAR | Status: AC
  Filled 2018-10-25: qty 5

## 2018-10-25 MED ORDER — HYDROMORPHONE HCL 1 MG/ML IJ SOLN: 0.2500 mg | INTRAMUSCULAR | Status: DC | PRN

## 2018-10-25 MED ORDER — LIDOCAINE VISCOUS HCL 2 % MT SOLN: 5.0000 mL | Freq: Once | OROMUCOSAL | Status: DC

## 2018-10-25 NOTE — Op Note (Signed)
[facility] Patient Name: Edward Fleming Procedure Date: 10/25/2018 3:39 PM MRN: [id number] Date of Birth: 1950/05/03 Attending MD: Barney Drain MD, MD CSN: [id number] Age: 68 Admit Type: Inpatient Procedure:                Upper GI endoscopy WITH COLD FORCEPS BIOPSY Indications:              Melena Providers:                Barney Drain MD, MD, Hinton Rao, RN, Randa Spike, Technician Referring MD:             Teodora Medici. Kim Medicines:                Propofol per Anesthesia Complications:            No immediate complications. Estimated Blood Loss:     Estimated blood loss was minimal. Procedure:                Pre-Anesthesia Assessment:                           - Prior to the procedure, a History and Physical                            was performed, and patient medications and                            allergies were reviewed. The patient's tolerance of                            previous anesthesia was also reviewed. The risks                            and benefits of the procedure and the sedation                            options and risks were discussed with the patient.                            All questions were answered, and informed consent                            was obtained. Prior Anticoagulants: The patient has                            taken Plavix (clopidogrel), last dose was 2 days                            prior to procedure. ASA Grade Assessment: II - A                            patient with mild systemic disease. After reviewing  the risks and benefits, the patient was deemed in                            satisfactory condition to undergo the procedure.                            After obtaining informed consent, the endoscope was                            passed under direct vision. Throughout the                            procedure, the patient's blood pressure, pulse, and              oxygen saturations were monitored continuously. The                            [id number]-[id number] ([id number]) scope was introduced through the                            mouth, and advanced to the second part of duodenum.                            The upper GI endoscopy was accomplished without                            difficulty. The patient tolerated the procedure                            well. Scope In: 4:02:28 PM Scope Out: 4:09:10 PM Total Procedure Duration: 0 hours 6 minutes 42 seconds  Findings:      The examined esophagus was normal.      Diffuse moderate inflammation characterized by congestion (edema),       erosions and erythema was found in the gastric body and in the gastric       antrum. Biopsies(2:BODY, 1:INCISURA, 1:ANTRUM) were taken with a cold       forceps for Helicobacter pylori testing.      One non-bleeding cratered duodenal ulcer with no stigmata of bleeding       was found in the duodenal bulb.      The second portion of the duodenum was normal. Impression:               .                           - MODERATE Gastritis. Biopsied.                           - MELENA DUE TO Non-bleeding duodenal BULB                            ulcer-LOW RISK FOR REBLEEDING Moderate Sedation:      Per Anesthesia Care Recommendation:           - Return patient to hospital ward for ongoing care.                           -  Soft diet and cardiac diet.                           - Continue present medications. RE-START ASA/PLAVIX                            SEP 30.                           - Await pathology results.                           - Return to GI office in 6 months. Procedure Code(s):        --- Professional ---                           [phone], Esophagogastroduodenoscopy, flexible,                            transoral; with biopsy, single or multiple Diagnosis Code(s):        --- Professional ---                           K29.70, Gastritis, unspecified, without  bleeding                           K26.9, Duodenal ulcer, unspecified as acute or                            chronic, without hemorrhage or perforation                           K92.1, Melena (includes Hematochezia) CPT copyright 2019 American Medical Association. All rights reserved. The codes documented in this report are preliminary and upon coder review may  be revised to meet current compliance requirements. Jonette Eva, MD Jonette Eva MD, MD 10/25/2018 4:25:31 PM This report has been signed electronically. Number of Addenda: 0

## 2018-10-25 NOTE — H&P (Signed)
Primary Care Physician:  System, Pcp Not In Primary Gastroenterologist:  Dr. Oneida Alar  Pre-Procedure History & Physical: HPI:  Edward Fleming is a 68 y.o. male here for MELENA/anemia.  Past Medical History:  Diagnosis Date  . CAD (coronary artery disease)    a. Anterolateral STEMI 12/28/12 s/p DES-mid LCx, DES x 2-mid LAD b. Echo 12/29/12: EF 65-70%, PASP 42 mmHg, no WMAs  . Hyperlipidemia   . Hypertension   . Type 2 diabetes mellitus (HCC)    Hgb A1C 6.8% on 12/28/12    Past Surgical History:  Procedure Laterality Date  . CORONARY ANGIOPLASTY WITH STENT PLACEMENT  12/28/2012   20% prox LAD, 99% mid LAD, 90% prox OM1, 100% mid LCx, 30% prox, mid and distal RCA disease; EF 55% with apical hypokinesis  . LEFT HEART CATHETERIZATION WITH CORONARY ANGIOGRAM N/A 12/28/2012   Procedure: LEFT HEART CATHETERIZATION WITH CORONARY ANGIOGRAM;  Surgeon: Burnell Blanks, MD;  Location: [facility] CATH LAB;  Service: Cardiovascular;  Laterality: N/A;  . thumb surgery Left     Prior to Admission medications   Medication Sig Start Date End Date Taking? Authorizing Provider  aspirin EC 81 MG tablet Take 81 mg by mouth daily.    Yes [provider]  atorvastatin (LIPITOR) 80 MG tablet Take 80 mg by mouth daily at 6 PM. 12/31/12  Yes Arguello, Roger A, PA-C  clopidogrel (PLAVIX) 75 MG tablet Take 75 mg by mouth daily with breakfast.   Yes [provider]  nitroGLYCERIN (NITROSTAT) 0.4 MG SL tablet Place 1 tablet (0.4 mg total) under the tongue every 5 (five) minutes x 3 doses as needed for chest pain. 12/31/12  Yes Arguello, Roger A, PA-C  amLODipine (NORVASC) 10 MG tablet Take 10 mg by mouth daily.     [provider]  metoprolol tartrate (LOPRESSOR) 25 MG tablet Take 1 tablet (25 mg total) by mouth 2 (two) times daily. 12/31/12   Arguello, Roger A, PA-C    Allergies as of 10/24/2018 - Review Complete 10/24/2018  Allergen Reaction Noted  . Ticagrelor  12/30/2012    Family  History  Problem Relation Age of Onset  . Heart disease Brother   . Cirrhosis Sister        alcohol-related  . Colon cancer Neg Hx   . Colon polyps Neg Hx     Social History   Socioeconomic History  . Marital status: Married    Spouse name: Not on file  . Number of children: Not on file  . Years of education: Not on file  . Highest education level: Not on file  Occupational History  . Occupation: Mining engineer  Social Needs  . Financial resource strain: Not on file  . Food insecurity    Worry: Not on file    Inability: Not on file  . Transportation needs    Medical: Not on file    Non-medical: Not on file  Tobacco Use  . Smoking status: Former Smoker    Packs/day: 1.00    Years: 40.00    Pack years: 40.00    Types: Cigars    Quit date: 09/28/2012    Years since quitting: 6.0  . Smokeless tobacco: Never Used  . Tobacco comment: Continues to smoke cigars  Substance and Sexual Activity  . Alcohol use: No    Comment: No abuse  . Drug use: No  . Sexual activity: Not Currently  Lifestyle  . Physical activity    Days per week: Not  on file    Minutes per session: Not on file  . Stress: Not on file  Relationships  . Social Musician on phone: Not on file    Gets together: Not on file    Attends religious service: Not on file    Active member of club or organization: Not on file    Attends meetings of clubs or organizations: Not on file    Relationship status: Not on file  . Intimate partner violence    Fear of current or ex partner: Not on file    Emotionally abused: Not on file    Physically abused: Not on file    Forced sexual activity: Not on file  Other Topics Concern  . Not on file  Social History Narrative   Lives with wife.    Review of Systems: See HPI, otherwise negative ROS   Physical Exam: BP 117/83   Pulse 70   Temp 98.4 F (36.9 C) (Oral)   Resp 20   Ht 5\' 3"  (1.6 m)   Wt 71.9 kg   SpO2 94%   BMI 28.08 kg/m   General:   Alert,  pleasant and cooperative in NAD Head:  Normocephalic and atraumatic. Neck:  Supple; Lungs:  Clear throughout to auscultation.    Heart:  Regular rate and rhythm. Abdomen:  Soft, nontender and nondistended. Normal bowel sounds, without guarding, and without rebound.   Neurologic:  Alert and  oriented x4;  grossly normal neurologically.  Impression/Plan:     MELENA/anemia  PLAN: 1. EGD today.  DISCUSSED PROCEDURE, BENEFITS, & RISKS: < 1% chance of medication reaction, bleeding, perforation, or ASPIRATION.

## 2018-10-25 NOTE — Transfer of Care (Signed)
Immediate Anesthesia Transfer of Care Note  Patient: Codey Hintz  Procedure(s) Performed: ESOPHAGOGASTRODUODENOSCOPY (EGD) WITH PROPOFOL (N/A )  Patient Location: PACU  Anesthesia Type:General  Level of Consciousness: awake, alert , oriented and patient cooperative  Airway & Oxygen Therapy: Patient Spontanous Breathing  Post-op Assessment: Report given to RN and Post -op Vital signs reviewed and stable  Post vital signs: Reviewed and stable  Last Vitals:  Vitals Value Taken Time  BP    Temp    Pulse 90 10/25/18 1619  Resp 19 10/25/18 1619  SpO2 95 % 10/25/18 1619  Vitals shown include unvalidated device data.  Last Pain:  Vitals:   10/25/18 1410  TempSrc: Oral  PainSc: 0-No pain      Patients Stated Pain Goal: 7 (49/20/10 0712)  Complications: No apparent anesthesia complications

## 2018-10-25 NOTE — Anesthesia Procedure Notes (Signed)
Procedure Name: General with mask airway Date/Time: 10/25/2018 4:55 PM Performed by: Andree Elk, Amy A, CRNA Pre-anesthesia Checklist: Timeout performed, Patient being monitored, Suction available, Emergency Drugs available and Patient identified Oxygen Delivery Method: Non-rebreather mask

## 2018-10-25 NOTE — Anesthesia Postprocedure Evaluation (Signed)
Anesthesia Post Note  Patient: Edward Fleming  Procedure(s) Performed: ESOPHAGOGASTRODUODENOSCOPY (EGD) WITH PROPOFOL (N/A ) BIOPSY  Patient location during evaluation: PACU Anesthesia Type: General Level of consciousness: awake and alert, oriented and patient cooperative Pain management: pain level controlled Vital Signs Assessment: post-procedure vital signs reviewed and stable Respiratory status: spontaneous breathing Cardiovascular status: stable Postop Assessment: no apparent nausea or vomiting Anesthetic complications: no     Last Vitals:  Vitals:   10/25/18 1315 10/25/18 1410  BP:  117/83  Pulse: 70   Resp:  20  Temp: 36.8 C 36.9 C  SpO2: 98% 94%    Last Pain:  Vitals:   10/25/18 1410  TempSrc: Oral  PainSc: 0-No pain                 Damont Balles A

## 2018-10-25 NOTE — Progress Notes (Signed)
CRITICAL VALUE ALERT  Critical Value:  Hgb 6.3  Date & Time Notied:  1030 10-25-2018  Provider Notified: Opyd  Orders Received/Actions taken: waiting for orders

## 2018-10-25 NOTE — Progress Notes (Addendum)
Admitted with acute blood loss anemia, heme positive stool, melena, in setting of aspirin and Plavix. NPO for EGD today with Propofol. Admitting Hgb 7.9. This morning Hgb 6.3. PRBCs ordered, starting at 0820. EGD with Propofol by Dr. Oneida Alar later today. Continue with transfusion. Aspirin and plavix on hold. Protonix infusion continues that was started on admission. Patient denies any further overt GI bleeding since admission.

## 2018-10-25 NOTE — Progress Notes (Addendum)
PROGRESS NOTE  Edward Fleming YBW:[id number] DOB: 1951-01-15 DOA: 10/24/2018 PCP: System, Pcp Not In  Brief History:  68 year old male with a history of coronary artery disease/STEMI 2014 with DES x2 to the mid LAD, hyperlipidemia, hypertension, diabetes mellitus type 2 presenting with syncope.  The patient woke up around 1 AM on 10/24/2018 because he was having leg cramps.  He describes some shortness of breath and diaphoresis.  He felt like this was similar symptoms as when he had his heart attack in 2014.  He took nitroglycerin shortly after which he syncopized.  He woke up on the floor and did not recall the events.  He denies biting his tongue or urinary or bowel incontinence.  Prior to the above episode, the patient had been in his usual state of health.  He had denied any previous chest discomfort, shortness of breath, coughing, dizziness, or syncope.  There is been no fevers, chills, nausea, vomiting, diarrhea, abdominal pain, dysuria, hematuria.  He endorses compliance with all his medications.  He states that he has been able to perform his usual daily activities without any chest discomfort or shortness of breath. In the emergency department, the patient was afebrile hemodynamically stable saturating 100% room air.  BMP, LFTs were unremarkable.  Hemoglobin was 7.9 which was a significant drop from January 2015 when his hemoglobin was 13.3.  FOBT was positive.  EKG shows sinus rhythm with nonspecific T wave changes.  Assessment/Plan: Syncope -Appears to be vagally mediated and hypovolemia as this occurred after the patient took nitroglycerin SL -Patient also recently started Flomax 1 to 2 weeks ago -Check orthostatics--neg -Echocardiogram--EF 55-60%, impaired relaxation, apical AK-unchanged -Monitor on telemetry -Cardiology consult -10/24/18--poor quality but no large central PE  Acute blood loss anemia/melena -Hemoglobin dropped more than 5 g since 2015 -Continue Protonix  drip -GI consult-->planning EGD -Hold Plavix/ASA for now pending GI consult -9/29--transfuse 1 unit PRBC  Coronary artery disease history of STEMI/Elevated Troponin -Personally reviewed EKG--sinus rhythm, nonspecific T wave change -Troponins not consistent with ACS--likely demand ischemia due to ABLA -Cardiology eval  Iron deficiency Anemia -iron sat 6%, ferritin 11 -transfuse Feraheme once GI work up is done  B12 deficiency -B12--165 -start supplementation  Diabetes mellitus type 2 -Patient not on any agents prior to admission -NovoLog sliding scale -9/28-Hemoglobin A1c--6.4  Essential hypertension -Holding amlodipine secondary to soft blood pressure -reduce dose metoprolol titrate due to soft BP  Tobacco abuse -I have discussed tobacco cessation with the patient.  I have counseled the patient regarding the negative impacts of continued tobacco use including but not limited to lung cancer, COPD, and cardiovascular disease.  I have discussed alternatives to tobacco and modalities that may help facilitate tobacco cessation including but not limited to biofeedback, hypnosis, and medications.  Total time spent with tobacco counseling was 4 minutes. -100 pack year hx        Disposition Plan:   Home in 2-3 days  Family Communication:   Spouse updated at bedside 9/28  Consultants:  GI, cardiology  Code Status:  FULL  DVT Prophylaxis:  SCDs   Procedures: As Listed in Progress Note Above  Antibiotics: None     Subjective: Pt had some left chest discomfort this am, but denies sob, dizziness, n/v/d, abd pain.  Denies f/c, cough, hematochezia, melena  Objective: Vitals:   10/25/18 0300 10/25/18 0400 10/25/18 0500 10/25/18 0751  BP:  (!) 95/41    Pulse: 86 90 93  Resp:      Temp:  98.2 F (36.8 C)  98.1 F (36.7 C)  TempSrc:  Oral  Oral  SpO2: 96% 95% 97%   Weight:      Height:        Intake/Output Summary (Last 24 hours) at 10/25/2018  0818 Last data filed at 10/25/2018 0700 Gross per 24 hour  Intake 1397.49 ml  Output 700 ml  Net 697.49 ml   Weight change: -14.6 kg Exam:   General:  Pt is alert, follows commands appropriately, not in acute distress  HEENT: No icterus, No thrush, No neck mass, [place]/AT  Cardiovascular: RRR, S1/S2, no rubs, no gallops  Respiratory: bibasilar rales  Abdomen: Soft/+BS, non tender, non distended, no guarding  Extremities: No edema, No lymphangitis, No petechiae, No rashes, no synovitis   Data Reviewed: I have personally reviewed following labs and imaging studies Basic Metabolic Panel: Recent Labs  Lab 10/24/18 0351 10/25/18 0422  NA 139 139  K 4.2 3.9  CL 106 106  CO2 24 23  GLUCOSE 163* 111*  BUN 60* 25*  CREATININE 1.07 0.90  CALCIUM 8.0* 8.4*   Liver Function Tests: Recent Labs  Lab 10/24/18 0351 10/25/18 0422  AST 21 25  ALT 24 18  ALKPHOS 60 52  BILITOT 0.5 0.7  PROT 6.1* 5.7*  ALBUMIN 3.3* 3.3*   No results for input(s): LIPASE, AMYLASE in the last 168 hours. No results for input(s): AMMONIA in the last 168 hours. Coagulation Profile: No results for input(s): INR, PROTIME in the last 168 hours. CBC: Recent Labs  Lab 10/24/18 0351 10/24/18 1211 10/25/18 0422  WBC 11.8* 10.1 8.2  NEUTROABS 9.2*  --   --   HGB 7.9* 7.5* 6.3*  HCT 24.7* 24.5* 20.7*  MCV 96.1 98.4 95.8  PLT 264 262 210   Cardiac Enzymes: No results for input(s): CKTOTAL, CKMB, CKMBINDEX, TROPONINI in the last 168 hours. BNP: Invalid input(s): POCBNP CBG: Recent Labs  Lab 10/24/18 1640 10/24/18 2003 10/25/18 0120 10/25/18 0439 10/25/18 0752  GLUCAP 122* 173* 102* 107* 144*   HbA1C: Recent Labs    10/24/18 0352  HGBA1C 6.4*   Urine analysis: No results found for: COLORURINE, APPEARANCEUR, LABSPEC, PHURINE, GLUCOSEU, HGBUR, BILIRUBINUR, KETONESUR, PROTEINUR, UROBILINOGEN, NITRITE, LEUKOCYTESUR Sepsis Labs: @LABRCNTIP (procalcitonin:4,lacticidven:4) ) Recent Results  (from the past 240 hour(s))  SARS Coronavirus 2 [facility](Hospital order, Performed [facility]) Nasopharyngeal Nasopharyngeal Swab     Status: None   Collection Time: 10/24/18  8:17 AM   Specimen: Nasopharyngeal Swab  Result Value Ref Range Status   SARS Coronavirus 2 NEGATIVE NEGATIVE Final    Comment: (NOTE) If result is NEGATIVE SARS-CoV-2 target nucleic acids are NOT DETECTED. The SARS-CoV-2 RNA is generally detectable in upper and lower  respiratory specimens during the acute phase of infection. The lowest  concentration of SARS-CoV-2 viral copies this assay can detect is 250  copies / mL. A negative result does not preclude SARS-CoV-2 infection  and should not be used as the sole basis for treatment or other  patient management decisions.  A negative result may occur with  improper specimen collection / handling, submission of specimen other  than nasopharyngeal swab, presence of viral mutation(s) within the  areas targeted by this assay, and inadequate number of viral copies  (<250 copies / mL). A negative result must be combined with clinical  observations, patient history, and epidemiological information. If result is POSITIVE SARS-CoV-2 target nucleic acids are DETECTED. The SARS-CoV-2 RNA  is generally detectable in upper and lower  respiratory specimens dur ing the acute phase of infection.  Positive  results are indicative of active infection with SARS-CoV-2.  Clinical  correlation with patient history and other diagnostic information is  necessary to determine patient infection status.  Positive results do  not rule out bacterial infection or co-infection with other viruses. If result is PRESUMPTIVE POSTIVE SARS-CoV-2 nucleic acids MAY BE PRESENT.   A presumptive positive result was obtained on the submitted specimen  and confirmed on repeat testing.  While 2019 novel coronavirus  (SARS-CoV-2) nucleic acids may be present in the submitted sample  additional  confirmatory testing may be necessary for epidemiological  and / or clinical management purposes  to differentiate between  SARS-CoV-2 and other Sarbecovirus currently known to infect humans.  If clinically indicated additional testing with an alternate test  methodology [phone]) is advised. The SARS-CoV-2 RNA is generally  detectable in upper and lower respiratory sp ecimens during the acute  phase of infection. The expected result is Negative. Fact Sheet for Patients:  BoilerBrush.com.cy Fact Sheet for Healthcare Providers: https://pope.com/ This test is not yet approved or cleared by the [place] FDA and has been authorized for detection and/or diagnosis of SARS-CoV-2 by FDA under an Emergency Use Authorization (EUA).  This EUA will remain in effect (meaning this test can be used) for the duration of the COVID-19 declaration under Section 564(b)(1) of the Act, 21 U.S.C. section 360bbb-3(b)(1), unless the authorization is terminated or revoked sooner. Performed at [facility], [address], [place], [place] [address]   MRSA PCR Screening     Status: None   Collection Time: 10/24/18 12:40 PM   Specimen: Nasal Mucosa; Nasopharyngeal  Result Value Ref Range Status   MRSA by PCR NEGATIVE NEGATIVE Final    Comment:        The GeneXpert MRSA Assay (FDA approved for NASAL specimens only), is one component of a comprehensive MRSA colonization surveillance program. It is not intended to diagnose MRSA infection nor to guide or monitor treatment for MRSA infections. Performed at [facility], [address] [place], [place] [address]      Scheduled Meds:  sodium chloride   Intravenous Once   Chlorhexidine Gluconate Cloth  6 each Topical Daily   influenza vaccine adjuvanted  0.5 mL Intramuscular Tomorrow-1000   insulin aspart  0-9 Units Subcutaneous Q4H   metoprolol tartrate  25 mg Oral BID   Continuous Infusions:   pantoprozole (PROTONIX) infusion 8 mg/hr (10/25/18 0700)    Procedures/Studies: Ct Head Wo Contrast  Result Date: 10/24/2018 CLINICAL DATA:  Syncope and fall. EXAM: CT HEAD WITHOUT CONTRAST TECHNIQUE: Contiguous axial images were obtained from the base of the skull through the vertex without intravenous contrast. COMPARISON:  None. FINDINGS: Brain: No evidence of acute infarction, hemorrhage, hydrocephalus, extra-axial collection or mass lesion/mass effect. Remote lacunar infarct at the left caudate head, see coronal reformats. Vascular: No hyperdense vessel or unexpected calcification. Skull: Normal. Negative for fracture or focal lesion. Sinuses/Orbits: No acute finding. IMPRESSION: 1. No acute finding. 2. 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 Electronically Signed   By: Marnee Spring M.D.   On: 10/24/2018 09:20   Ct Angio Chest Pe W Or Wo Contrast  Result Date: 10/24/2018 CLINICAL DATA:  PE suspected. Positive D-dimer. Shortness of breath. EXAM: CT ANGIOGRAPHY CHEST WITH CONTRAST TECHNIQUE: Multidetector CT imaging of the chest was performed using the standard protocol during bolus administration of intravenous contrast. Multiplanar CT  image reconstructions and MIPs were obtained to evaluate the vascular anatomy. CONTRAST:  100mL OMNIPAQUE IOHEXOL 350 MG/ML SOLN COMPARISON:  None. FINDINGS: Cardiovascular: Evaluation for the detection of pulmonary emboli is nearly nondiagnostic secondary to a suboptimal contrast bolus timing. There is no convincing large centrally located pulmonary embolus. Detection of smaller pulmonary emboli is not possible on this exam.Heart size is normal. There is no significant pericardial effusion. There is no evidence for an aortic dissection. Atherosclerotic changes are noted of the thoracic aorta and coronary arteries. Mediastinum/Nodes: --No mediastinal or hilar lymphadenopathy. --No axillary lymphadenopathy. --No supraclavicular lymphadenopathy. --Normal thyroid  gland. --The esophagus is unremarkable Lungs/Pleura: There are mild emphysematous changes. The lungs are essentially clear. There is no pneumothorax or pleural effusion. The trachea is unremarkable. Upper Abdomen: No acute abnormality. Musculoskeletal: No chest wall abnormality. No acute or significant osseous findings. Review of the MIP images confirms the above findings. IMPRESSION: Evaluation for the detection of pulmonary emboli is nearly nondiagnostic secondary to a suboptimal contrast bolus timing. There is no convincing large centrally located pulmonary embolus. Detection of smaller pulmonary emboli is not possible on this exam. Aortic Atherosclerosis (ICD10-I70.0) and Emphysema (ICD10-J43.9). Electronically Signed   By: Katherine Mantlehristopher  Green M.D.   On: 10/24/2018 12:50   [place] Carotid Bilateral  Result Date: 10/24/2018 CLINICAL DATA:  68 year old male with a history of syncope EXAM: BILATERAL CAROTID DUPLEX ULTRASOUND TECHNIQUE: Wallace CullensGray scale imaging, color Doppler and duplex ultrasound were performed of bilateral carotid and vertebral arteries in the neck. COMPARISON:  None. FINDINGS: Criteria: Quantification of carotid stenosis is based on velocity parameters that correlate the residual internal carotid diameter with NASCET-based stenosis levels, using the diameter of the distal internal carotid lumen as the denominator for stenosis measurement. The following velocity measurements were obtained: RIGHT ICA:  Systolic 121 cm/sec, Diastolic 44 cm/sec CCA:  83 cm/sec SYSTOLIC ICA/CCA RATIO:  1.5 ECA:  118 cm/sec LEFT ICA:  Systolic 78 cm/sec, Diastolic 32 cm/sec CCA:  87 cm/sec SYSTOLIC ICA/CCA RATIO:  0.9 ECA:  80 cm/sec Right Brachial SBP: Not acquired Left Brachial SBP: Not acquired RIGHT CAROTID ARTERY: No significant calcifications of the right common carotid artery. Intermediate waveform maintained. Heterogeneous and partially calcified plaque at the right carotid bifurcation. No significant lumen shadowing.  Low resistance waveform of the right ICA. Significant tortuosity. RIGHT VERTEBRAL ARTERY: Antegrade flow with low resistance waveform. LEFT CAROTID ARTERY: No significant calcifications of the left common carotid artery. Intermediate waveform maintained. Heterogeneous and partially calcified plaque at the left carotid bifurcation without significant lumen shadowing. Low resistance waveform of the left ICA. Significant tortuosity. LEFT VERTEBRAL ARTERY:  Antegrade flow with low resistance waveform. IMPRESSION: Color duplex indicates minimal heterogeneous and calcified plaque, with no hemodynamically significant stenosis by duplex criteria in the extracranial cerebrovascular circulation. Signed, Yvone NeuJaime S. Reyne DumasWagner, DO, RPVI [facility] Electronically Signed   By: Gilmer MorJaime  Wagner D.O.   On: 10/24/2018 09:55   Dg Chest Port 1 View  Result Date: 10/24/2018 CLINICAL DATA:  Shortness of breath EXAM: PORTABLE CHEST 1 VIEW COMPARISON:  12/29/2012 FINDINGS: Normal heart size and mediastinal contours. There is mild wasting of the trachea at the thoracic inlet that is unchanged, question goiter. No acute infiltrate or edema. No effusion or pneumothorax. No acute osseous findings. IMPRESSION: 1. No evidence of acute disease. 2. Mild tapering of the trachea at the thoracic inlet, stable from 2014. Question goiter-please correlate with neck exam. Electronically Signed   By: Kathrynn DuckingJonathon  Watts M.D.  On: 10/24/2018 04:40    Orson Eva, DO  [facility] Pager [phone]  If 7PM-7AM, please contact night-coverage [email or web address] Password [email or web address] 10/25/2018, 8:18 AM   LOS: 1 day

## 2018-10-25 NOTE — Consult Note (Signed)
Cardiology Consultation:   Patient ID: Edward Fleming MRN: [id number]; DOB: 06/04/1950  Admit date: 10/24/2018 Date of Consult: 10/25/2018  Primary Care Provider: System, Pcp Not In Primary Cardiologist: [facility] Primary Electrophysiologist:  None   Patient Profile:   Edward Fleming is a 68 y.o. male with a hx of CAD who is being seen today for the evaluation of syncope at the request of Dr Tat.  History of Present Illness:   Edward Fleming 68 yo male history of CAD with prior STEMI 12/2012 received DES x1 to LCX and DES x 2 to mid LAD. History of HTN, HL, DM2 admitted with syncope after taking SL NG. Also issues with black tarry stools.    Reports dyspnea and fatigue over the last few days. On day of admission episode of SOB and cold sweat, no chest pain. The sweating and DOE was similar to what he had at the time of his prior MI and thus he took a NG. Shortly after taking NG episode of syncope. No prior issues of syncope. On admission found to be severely anemic, recent issues with black tarry stools. Admitted by medicine team.     WBC 11.8 Hgb 7.9 Plt 264 K 4.2 Cr 1.07 BUN 60 BNP 27 FOBT + Ddimer 0.64  Ferritin 11 hstrop 16-->31-->92--> EKG SR, no acute ischemic changes COVID neg  CXR no acute diseaes Carotid US: no significant disease CT head no acute process CT PE suboptimal bolus, essentially nondiagnostic. No clear large PE Echo 55-60%, grade I diastolic dysfunction, chronic apical hypokinesis  Heart Pathway Score:     Past Medical History:  Diagnosis Date   CAD (coronary artery disease)    a. Anterolateral STEMI 12/28/12 s/p DES-mid LCx, DES x 2-mid LAD b. Echo 12/29/12: EF 65-70%, PASP 42 mmHg, no WMAs   Hyperlipidemia    Hypertension    Obesity    Type 2 diabetes mellitus (HCC)    Hgb A1C 6.8% on 12/28/12    Past Surgical History:  Procedure Laterality Date   CORONARY ANGIOPLASTY WITH STENT PLACEMENT  12/28/2012   20% prox LAD, 99% mid LAD, 90% prox OM1,  100% mid LCx, 30% prox, mid and distal RCA disease; EF 55% with apical hypokinesis   LEFT HEART CATHETERIZATION WITH CORONARY ANGIOGRAM N/A 12/28/2012   Procedure: LEFT HEART CATHETERIZATION WITH CORONARY ANGIOGRAM;  Surgeon: Kathleene Hazelhristopher D McAlhany, MD;  [facility];  Service: Cardiovascular;  Laterality: N/A;   thumb surgery Left      Inpatient Medications: Scheduled Meds:  sodium chloride   Intravenous Once   Chlorhexidine Gluconate Cloth  6 each Topical Daily   cyanocobalamin  1,000 mcg Intramuscular Once   influenza vaccine adjuvanted  0.5 mL Intramuscular Tomorrow-1000   insulin aspart  0-9 Units Subcutaneous Q4H   metoprolol tartrate  12.5 mg Oral BID   [START ON 10/26/2018] vitamin B-12  500 mcg Oral Daily   Continuous Infusions:  pantoprozole (PROTONIX) infusion 8 mg/hr (10/25/18 0800)   PRN Meds: acetaminophen **OR** acetaminophen, ondansetron (ZOFRAN) IV  Allergies:    Allergies  Allergen Reactions   Ticagrelor     Possible cause of shortness of breath    Social History:   Social History   Socioeconomic History   Marital status: Married    Spouse name: Not on file   Number of children: Not on file   Years of education: Not on file   Highest education level: Not on file  Occupational History   Occupation: [employer]  Social Network [employer]   Financial resource strain: Not on file   Food insecurity    Worry: Not on file    Inability: Not on file   Transportation needs    Medical: Not on file    Non-medical: Not on file  Tobacco Use   Smoking status: Former Smoker    Packs/day: 1.00    Years: 40.00    Pack years: 40.00    Types: Cigars    Quit date: 09/28/2012    Years since quitting: 6.0   Smokeless tobacco: Never Used   Tobacco comment: Continues to smoke cigars  Substance and Sexual Activity   Alcohol use: No    Comment: No abuse   Drug use: No   Sexual activity: Not Currently  Lifestyle   Physical  activity    Days per week: Not on file    Minutes per session: Not on file   Stress: Not on file  Relationships   Social connections    Talks on phone: Not on file    Gets together: Not on file    Attends religious service: Not on file    Active member of club or organization: Not on file    Attends meetings of clubs or organizations: Not on file    Relationship status: Not on file   Intimate partner violence    Fear of current or ex partner: Not on file    Emotionally abused: Not on file    Physically abused: Not on file    Forced sexual activity: Not on file  Other Topics Concern   Not on file  Social History Narrative   Lives with wife.    Family History:    Family History  Problem Relation Age of Onset   Heart disease Brother    Cirrhosis Sister        alcohol-related   Colon cancer Neg Hx    Colon polyps Neg Hx      ROS:  Please see the history of present illness.  All other ROS reviewed and negative.     Physical Exam/Data:   Vitals:   10/25/18 0820 10/25/18 0825 10/25/18 0830 10/25/18 0835  BP: 98/73  101/63 99/68  Pulse: 91 88 91 94  Resp:      Temp:    98.3 F (36.8 C)  TempSrc:    Oral  SpO2: 97% 97% 96% 99%  Weight:      Height:        Intake/Output Summary (Last 24 hours) at 10/25/2018 0859 Last data filed at 10/25/2018 0800 Gross per 24 hour  Intake 1322.47 ml  Output 800 ml  Net 522.47 ml   Last 3 Weights 10/24/2018 10/24/2018 06/07/2013  Weight (lbs) 158 lb 8.2 oz 190 lb 11.2 oz 190 lb 12.8 oz  Weight (kg) 71.9 kg 86.5 kg 86.546 kg     Body mass index is 28.08 kg/m.  General:  Well nourished, well developed, in no acute distress HEENT: normal Lymph: no adenopathy Neck: no JVD Endocrine:  No thryomegaly Vascular: No carotid bruits; FA pulses 2+ bilaterally without bruits  Cardiac:  normal S1, S2; RRR; no murmur Lungs:  clear to auscultation bilaterally, no wheezing, rhonchi or rales  Abd: soft, nontender, no hepatomegaly  Ext:  no edema Musculoskeletal:  No deformities, BUE and BLE strength normal and equal Skin: warm and dry  Neuro:  CNs 2-12 intact, no focal abnormalities noted Psych:  Normal affect     Laboratory Data:  High Sensitivity  Troponin:   Recent Labs  Lab 10/24/18 0351 10/24/18 0555 10/24/18 1211  TROPONINIHS 16 31* 92*     Chemistry Recent Labs  Lab 10/24/18 0351 10/25/18 0422  NA 139 139  K 4.2 3.9  CL 106 106  CO2 24 23  GLUCOSE 163* 111*  BUN 60* 25*  CREATININE 1.07 0.90  CALCIUM 8.0* 8.4*  GFRNONAA >60 >60  GFRAA >60 >60  ANIONGAP 9 10    Recent Labs  Lab 10/24/18 0351 10/25/18 0422  PROT 6.1* 5.7*  ALBUMIN 3.3* 3.3*  AST 21 25  ALT 24 18  ALKPHOS 60 52  BILITOT 0.5 0.7   Hematology Recent Labs  Lab 10/24/18 0351 10/24/18 1211 10/25/18 0422  WBC 11.8* 10.1 8.2  RBC 2.57* 2.49* 2.16*  HGB 7.9* 7.5* 6.3*  HCT 24.7* 24.5* 20.7*  MCV 96.1 98.4 95.8  MCH 30.7 30.1 29.2  MCHC 32.0 30.6 30.4  RDW 14.8 15.0 15.5  PLT 264 262 210   BNP Recent Labs  Lab 10/24/18 0351  BNP 27.0    DDimer  Recent Labs  Lab 10/24/18 0807  DDIMER 0.64*     Radiology/Studies:  Ct Head Wo Contrast  Result Date: 10/24/2018 CLINICAL DATA:  Syncope and fall. EXAM: CT HEAD WITHOUT CONTRAST TECHNIQUE: Contiguous axial images were obtained from the base of the skull through the vertex without intravenous contrast. COMPARISON:  None. FINDINGS: Brain: No evidence of acute infarction, hemorrhage, hydrocephalus, extra-axial collection or mass lesion/mass effect. Remote lacunar infarct at the left caudate head, see coronal reformats. Vascular: No hyperdense vessel or unexpected calcification. Skull: Normal. Negative for fracture or focal lesion. Sinuses/Orbits: No acute finding. IMPRESSION: 1. No acute finding. 2. 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 Electronically Signed   By: Marnee Spring M.D.   On: 10/24/2018 09:20   Ct Angio Chest Pe W Or Wo Contrast  Result Date:  10/24/2018 CLINICAL DATA:  PE suspected. Positive D-dimer. Shortness of breath. EXAM: CT ANGIOGRAPHY CHEST WITH CONTRAST TECHNIQUE: Multidetector CT imaging of the chest was performed using the standard protocol during bolus administration of intravenous contrast. Multiplanar CT image reconstructions and MIPs were obtained to evaluate the vascular anatomy. CONTRAST:  OMNIPAQUE IOHEXOL 350 MG/ML SOLN COMPARISON:  None. FINDINGS: Cardiovascular: Evaluation for the detection of pulmonary emboli is nearly nondiagnostic secondary to a suboptimal contrast bolus timing. There is no convincing large centrally located pulmonary embolus. Detection of smaller pulmonary emboli is not possible on this exam.Heart size is normal. There is no significant pericardial effusion. There is no evidence for an aortic dissection. Atherosclerotic changes are noted of the thoracic aorta and coronary arteries. Mediastinum/Nodes: --No mediastinal or hilar lymphadenopathy. --No axillary lymphadenopathy. --No supraclavicular lymphadenopathy. --Normal thyroid gland. --The esophagus is unremarkable Lungs/Pleura: There are mild emphysematous changes. The lungs are essentially clear. There is no pneumothorax or pleural effusion. The trachea is unremarkable. Upper Abdomen: No acute abnormality. Musculoskeletal: No chest wall abnormality. No acute or significant osseous findings. Review of the MIP images confirms the above findings. IMPRESSION: Evaluation for the detection of pulmonary emboli is nearly nondiagnostic secondary to a suboptimal contrast bolus timing. There is no convincing large centrally located pulmonary embolus. Detection of smaller pulmonary emboli is not possible on this exam. Aortic Atherosclerosis (ICD10-I70.0) and Emphysema (ICD10-J43.9). Electronically Signed   By: Katherine Mantle M.D.   On: 10/24/2018 12:50   US Carotid Bilateral  Result Date: 10/24/2018 CLINICAL DATA:  68 year old male with a history of syncope  EXAM: BILATERAL CAROTID  DUPLEX ULTRASOUND TECHNIQUE: Wallace Cullens scale imaging, color Doppler and duplex ultrasound were performed of bilateral carotid and vertebral arteries in the neck. COMPARISON:  None. FINDINGS: Criteria: Quantification of carotid stenosis is based on velocity parameters that correlate the residual internal carotid diameter with NASCET-based stenosis levels, using the diameter of the distal internal carotid lumen as the denominator for stenosis measurement. The following velocity measurements were obtained: RIGHT ICA:  Systolic 121 cm/sec, Diastolic 44 cm/sec CCA:  83 cm/sec SYSTOLIC ICA/CCA RATIO:  1.5 ECA:  118 cm/sec LEFT ICA:  Systolic 78 cm/sec, Diastolic 32 cm/sec CCA:  87 cm/sec SYSTOLIC ICA/CCA RATIO:  0.9 ECA:  80 cm/sec Right Brachial SBP: Not acquired Left Brachial SBP: Not acquired RIGHT CAROTID ARTERY: No significant calcifications of the right common carotid artery. Intermediate waveform maintained. Heterogeneous and partially calcified plaque at the right carotid bifurcation. No significant lumen shadowing. Low resistance waveform of the right ICA. Significant tortuosity. RIGHT VERTEBRAL ARTERY: Antegrade flow with low resistance waveform. LEFT CAROTID ARTERY: No significant calcifications of the left common carotid artery. Intermediate waveform maintained. Heterogeneous and partially calcified plaque at the left carotid bifurcation without significant lumen shadowing. Low resistance waveform of the left ICA. Significant tortuosity. LEFT VERTEBRAL ARTERY:  Antegrade flow with low resistance waveform. IMPRESSION: Color duplex indicates minimal heterogeneous and calcified plaque, with no hemodynamically significant stenosis by duplex criteria in the extracranial cerebrovascular circulation. Signed, Yvone Neu. Reyne Dumas, RPVI [facility] Electronically Signed   By: Gilmer Mor D.O.   On: 10/24/2018 09:55   Dg Chest Port 1  View  Result Date: 10/24/2018 CLINICAL DATA:  Shortness of breath EXAM: PORTABLE CHEST 1 VIEW COMPARISON:  12/29/2012 FINDINGS: Normal heart size and mediastinal contours. There is mild wasting of the trachea at the thoracic inlet that is unchanged, question goiter. No acute infiltrate or edema. No effusion or pneumothorax. No acute osseous findings. IMPRESSION: 1. No evidence of acute disease. 2. Mild tapering of the trachea at the thoracic inlet, stable from 2014. Question goiter-please correlate with neck exam. Electronically Signed   By: Marnee Spring M.D.   On: 10/24/2018 04:40    Assessment and Plan:   1. Syncope Likely related to GI bleed/hypovlemia based on Hgb and Cr/BUN trends along with acute vasodilation and further decrease in preload from nitroglycerin use.  Echo is benign, no significant arrhytmias on telemetry - no further cardiac workup planned.    2. GI bleed - Hgb 7.9 on admit, down to 6.3 this AM, transfusion per primary team. WIth CAD would suggest transfusion to 9-10  3. Elevated trop/History of CAD - history of CAD with st - mild elevation in setting of severe anemia, EKG without acute ischemic changes, stable echo findings with normal LVEF and chronic apical hypokinesis - do not suspect ACS. Presentation consistent with symptomatic anemia, perhaps some supply related ischemia in setting of chronic disease. No plans for ischemic testing, not a candidate regardless for any invasive testing given his GI bleed.  - looks like he follows at the [place], from our records last intervention was in 2014. Unclear if they decided for long term DAPT due to extent of prior intervetion.  - ok to hold antiplatelet meds. Would restart ASA when ok from GI standpoint, would stay off plavix [facility], if stable could consider restarting at that point. He is not on plavix for any recent intervention  No limitations from cardiac standpoint on any GI  procedures.    [facility] will sign off.   Medication Recommendations:  Resume ASA when ok from GI stanpdoint, stay off plavix until outpatient follow up Other recommendations (labs, testing, etc):  none Follow up as an outpatient:  He will need to arrange with [facility] cardiology  For questions or updates, please contact [facility] Please consult [email or web address] for contact info under     Signed, Carlyle Dolly, MD  10/25/2018 8:59 AM

## 2018-10-25 NOTE — Anesthesia Preprocedure Evaluation (Signed)
Anesthesia Evaluation  Patient identified by MRN, date of birth, ID band Patient awake    Reviewed: Allergy & Precautions, NPO status , Patient's Chart, lab work & pertinent test results, reviewed documented beta blocker date and time   Airway Mallampati: II  TM Distance: >3 FB Neck ROM: Full    Dental no notable dental hx. (+) Teeth Intact   Pulmonary neg pulmonary ROS, former smoker,    Pulmonary exam normal breath sounds clear to auscultation       Cardiovascular Exercise Tolerance: Good hypertension, Pt. on medications and Pt. on home beta blockers + CAD, + Past MI and + Cardiac Stents  Normal cardiovascular examI Rhythm:Regular Rate:Normal  Reports syncopal episode after NTG prior to admission  Pt was profoundly anemic  Given blood  States can walk a mile slowly -denies recent CP Reports feeling improved after blood administration  Appreciate Cardiology note- no ischemic w/u planned now    Neuro/Psych negative neurological ROS  negative psych ROS   GI/Hepatic negative GI ROS, Neg liver ROS, Here for EGD for severe anemia    Endo/Other  negative endocrine ROSdiabetes, Type 2, Oral Hypoglycemic Agents  Renal/GU negative Renal ROS  negative genitourinary   Musculoskeletal negative musculoskeletal ROS (+)   Abdominal   Peds negative pediatric ROS (+)  Hematology negative hematology ROS (+) anemia ,   Anesthesia Other Findings   Reproductive/Obstetrics negative OB ROS                             Anesthesia Physical Anesthesia Plan  ASA: IV and emergent  Anesthesia Plan: General   Post-op Pain Management:    Induction: Intravenous  PONV Risk Score and Plan: 2 and Propofol infusion, TIVA, Treatment may vary due to age or medical condition and Ondansetron  Airway Management Planned: Nasal Cannula and Simple Face Mask  Additional Equipment:   Intra-op Plan:   Post-operative  Plan:   Informed Consent: I have reviewed the patients History and Physical, chart, labs and discussed the procedure including the risks, benefits and alternatives for the proposed anesthesia with the patient or authorized representative who has indicated his/her understanding and acceptance.     Dental advisory given  Plan Discussed with: CRNA  Anesthesia Plan Comments: (Plan Full PPE use  Plan GA with GETA as needed d/w pt -WTP with same after Q&A  D/w pt possible need for blood or postprocedural ventilation, he voiced understanding and WTP as above)        Anesthesia Quick Evaluation

## 2018-10-25 NOTE — Procedures (Signed)
Patient Name: Edward Fleming  MRN: [id number]  Epilepsy Attending: Lora Havens  Referring Physician/Provider: Dr Shanon Brow Tat Date: 10/25/2018 Duration: 23.23 mins  Patient history: 68 year old male with syncope.  EEG to evaluate for seizures.  Level of alertness: awake  AEDs during EEG study: None  Technical aspects: This EEG study was done with scalp electrodes positioned according to the 10-20 International system of electrode placement. Electrical activity was acquired at a sampling rate of 500Hz  and reviewed with a high frequency filter of 70Hz  and a low frequency filter of 1Hz . EEG data were recorded continuously and digitally stored.    DESCRIPTION:  The posterior dominant rhythm consists of 8-9 Hz activity of moderate voltage (25-35 uV) seen predominantly in posterior head regions, symmetric and reactive to eye opening and eye closing. Physiologic photic driving was seen during photic stimulation. Hyperventilation was not performed.  IMPRESSION: This study is within normal limits. No seizures or epileptiform discharges were seen throughout the recording.  [name] Barbra Sarks

## 2018-10-25 NOTE — Progress Notes (Signed)
EEG Completed; Results Pending  

## 2018-10-26 ENCOUNTER — Telehealth: Payer: Self-pay | Admitting: Gastroenterology

## 2018-10-26 ENCOUNTER — Encounter (HOSPITAL_COMMUNITY): Payer: Self-pay | Admitting: Gastroenterology

## 2018-10-26 DIAGNOSIS — I10 Essential (primary) hypertension: Secondary | ICD-10-CM

## 2018-10-26 DIAGNOSIS — K922 Gastrointestinal hemorrhage, unspecified: Secondary | ICD-10-CM

## 2018-10-26 DIAGNOSIS — E785 Hyperlipidemia, unspecified: Secondary | ICD-10-CM

## 2018-10-26 DIAGNOSIS — I2510 Atherosclerotic heart disease of native coronary artery without angina pectoris: Secondary | ICD-10-CM

## 2018-10-26 LAB — TYPE AND SCREEN
ABO/RH(D): B POS
Antibody Screen: NEGATIVE
Unit division: 0
Unit division: 0

## 2018-10-26 LAB — CBC
HCT: 30.6 % — ABNORMAL LOW (ref 39.0–52.0)
Hemoglobin: 9.8 g/dL — ABNORMAL LOW (ref 13.0–17.0)
MCH: 29.8 pg (ref 26.0–34.0)
MCHC: 32 g/dL (ref 30.0–36.0)
MCV: 93 fL (ref 80.0–100.0)
Platelets: 180 10*3/uL (ref 150–400)
RBC: 3.29 MIL/uL — ABNORMAL LOW (ref 4.22–5.81)
RDW: 15.8 % — ABNORMAL HIGH (ref 11.5–15.5)
WBC: 7.1 10*3/uL (ref 4.0–10.5)
nRBC: 0 % (ref 0.0–0.2)

## 2018-10-26 LAB — BASIC METABOLIC PANEL
Anion gap: 10 (ref 5–15)
BUN: 16 mg/dL (ref 8–23)
CO2: 26 mmol/L (ref 22–32)
Calcium: 8.7 mg/dL — ABNORMAL LOW (ref 8.9–10.3)
Chloride: 103 mmol/L (ref 98–111)
Creatinine, Ser: 0.98 mg/dL (ref 0.61–1.24)
GFR calc Af Amer: >60 mL/min (ref >60)
GFR calc non Af Amer: >60 mL/min (ref >60)
Glucose, Bld: 107 mg/dL — ABNORMAL HIGH (ref 70–99)
Potassium: 3.9 mmol/L (ref 3.5–5.1)
Sodium: 139 mmol/L (ref 135–145)

## 2018-10-26 LAB — BPAM RBC
Blood Product Expiration Date: 202010242359
Blood Product Expiration Date: 202010312359
ISSUE DATE / TIME: 202009290814
ISSUE DATE / TIME: 202009291056
Unit Type and Rh: 1700
Unit Type and Rh: 5100

## 2018-10-26 LAB — GLUCOSE, CAPILLARY
Glucose-Capillary: 109 mg/dL — ABNORMAL HIGH (ref 70–99)
Glucose-Capillary: 121 mg/dL — ABNORMAL HIGH (ref 70–99)

## 2018-10-26 LAB — HIV ANTIBODY (ROUTINE TESTING W REFLEX): HIV Screen 4th Generation wRfx: NONREACTIVE — AB

## 2018-10-26 MED ORDER — POLYSACCHARIDE IRON COMPLEX 150 MG PO CAPS: 150.0000 mg | ORAL_CAPSULE | Freq: Every day | ORAL | 2 refills | Status: AC

## 2018-10-26 MED ORDER — PANTOPRAZOLE SODIUM 40 MG PO TBEC: 40.0000 mg | DELAYED_RELEASE_TABLET | Freq: Two times a day (BID) | ORAL | 1 refills | Status: AC

## 2018-10-26 MED ORDER — CYANOCOBALAMIN 500 MCG PO TABS: 1000.0000 ug | ORAL_TABLET | Freq: Every day | ORAL | 2 refills | Status: AC

## 2018-10-26 NOTE — Progress Notes (Signed)
Patient is to be discharged home and in stable condition. Patient's IV and telemetry removed, WNL. Patient and wife given discharge instructions and verbalized understanding. All questions addressed and answered. Patient denies the need for a wheelchair escort out, requesting to ambulate to awaiting vehicle with staff assistance.  Celestia Khat, RN

## 2018-10-26 NOTE — Progress Notes (Addendum)
Subjective: No BM since admission. No overt GI bleeding. No abdominal pain, N/V. Tolerating diet. Wants to go home. Believes [facility] may need [facility] and will look into this.   Objective: Vital signs in last 24 hours: Temp:  [97.5 F (36.4 C)-98.5 F (36.9 C)] 98.1 F (36.7 C) (09/30 0758) Pulse Rate:  [61-95] 72 (09/30 0758) Resp:  [10-33] 20 (09/30 0758) BP: (87-122)/(58-84) 118/73 (09/30 0600) SpO2:  [94 %-99 %] 96 % (09/30 0758) Weight:  [72.5 kg] 72.5 kg (09/30 0500) Last BM Date: 10/24/18 General:   Alert and oriented, pleasant Head:  Normocephalic and atraumatic. Abdomen:  Bowel sounds present, soft, non-tender, non-distended. No HSM or hernias noted.  Extremities:  Without  edema. Neurologic:  Alert and  oriented x4  Intake/Output from previous day: 09/29 0701 - 09/30 0700 In: 1358.8 [I.V.:728.8; Blood:630] Out: 650 [Urine:650] Intake/Output this shift: No intake/output data recorded.  Lab Results: Recent Labs    10/24/18 1211 10/24/18 1254 10/25/18 0422 10/26/18 0424  WBC 10.1  --  8.2 7.1  HGB 7.5*  --  6.3* 9.8*  HCT 24.5* 20.3* 20.7* 30.6*  PLT 262  --  210 180   BMET Recent Labs    10/24/18 0351 10/25/18 0422 10/26/18 0424  NA 139 139 139  K 4.2 3.9 3.9  CL 106 106 103  CO2 24 23 26   GLUCOSE 163* 111* 107*  BUN 60* 25* 16  CREATININE 1.07 0.90 0.98  CALCIUM 8.0* 8.4* 8.7*   LFT Recent Labs    10/24/18 0351 10/25/18 0422  PROT 6.1* 5.7*  ALBUMIN 3.3* 3.3*  AST 21 25  ALT 24 18  ALKPHOS 60 52  BILITOT 0.5 0.7    Studies/Results: Ct Head Wo Contrast  Result Date: 10/24/2018 CLINICAL DATA:  Syncope and fall. EXAM: CT HEAD WITHOUT CONTRAST TECHNIQUE: Contiguous axial images were obtained from the base of the skull through the vertex without intravenous contrast. COMPARISON:  None. FINDINGS: Brain: No evidence of acute infarction, hemorrhage, hydrocephalus, extra-axial collection or mass lesion/mass  effect. Remote lacunar infarct at the left caudate head, see coronal reformats. Vascular: No hyperdense vessel or unexpected calcification. Skull: Normal. Negative for fracture or focal lesion. Sinuses/Orbits: No acute finding. IMPRESSION: 1. No acute finding. 2. 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 Electronically Signed   By: 10/26/2018 M.D.   On: 10/24/2018 09:20   Ct Angio Chest Pe W Or Wo Contrast  Result Date: 10/24/2018 CLINICAL DATA:  PE suspected. Positive D-dimer. Shortness of breath. EXAM: CT ANGIOGRAPHY CHEST WITH CONTRAST TECHNIQUE: Multidetector CT imaging of the chest was performed using the standard protocol during bolus administration of intravenous contrast. Multiplanar CT image reconstructions and MIPs were obtained to evaluate the vascular anatomy. CONTRAST:  10/26/2018 OMNIPAQUE IOHEXOL 350 MG/ML SOLN COMPARISON:  None. FINDINGS: Cardiovascular: Evaluation for the detection of pulmonary emboli is nearly nondiagnostic secondary to a suboptimal contrast bolus timing. There is no convincing large centrally located pulmonary embolus. Detection of smaller pulmonary emboli is not possible on this exam.Heart size is normal. There is no significant pericardial effusion. There is no evidence for an aortic dissection. Atherosclerotic changes are noted of the thoracic aorta and coronary arteries. Mediastinum/Nodes: --No mediastinal or hilar lymphadenopathy. --No axillary lymphadenopathy. --No supraclavicular lymphadenopathy. --Normal thyroid gland. --The esophagus is unremarkable Lungs/Pleura: There are mild emphysematous changes. The lungs are essentially clear. There is no pneumothorax or pleural effusion. The trachea is unremarkable. Upper Abdomen: No acute  abnormality. Musculoskeletal: No chest wall abnormality. No acute or significant osseous findings. Review of the MIP images confirms the above findings. IMPRESSION: Evaluation for the detection of pulmonary emboli is nearly nondiagnostic  secondary to a suboptimal contrast bolus timing. There is no convincing large centrally located pulmonary embolus. Detection of smaller pulmonary emboli is not possible on this exam. Aortic Atherosclerosis (ICD10-I70.0) and Emphysema (ICD10-J43.9). Electronically Signed   By: Constance Holster M.D.   On: 10/24/2018 12:50   US Carotid Bilateral  Result Date: 10/24/2018 CLINICAL DATA:  68 year old male with a history of syncope EXAM: BILATERAL CAROTID DUPLEX ULTRASOUND TECHNIQUE: Pearline Cables scale imaging, color Doppler and duplex ultrasound were performed of bilateral carotid and vertebral arteries in the neck. COMPARISON:  None. FINDINGS: Criteria: Quantification of carotid stenosis is based on velocity parameters that correlate the residual internal carotid diameter with NASCET-based stenosis levels, using the diameter of the distal internal carotid lumen as the denominator for stenosis measurement. The following velocity measurements were obtained: RIGHT ICA:  Systolic 952 cm/sec, Diastolic 44 cm/sec CCA:  83 cm/sec SYSTOLIC ICA/CCA RATIO:  1.5 ECA:  118 cm/sec LEFT ICA:  Systolic 78 cm/sec, Diastolic 32 cm/sec CCA:  87 cm/sec SYSTOLIC ICA/CCA RATIO:  0.9 ECA:  80 cm/sec Right Brachial SBP: Not acquired Left Brachial SBP: Not acquired RIGHT CAROTID ARTERY: No significant calcifications of the right common carotid artery. Intermediate waveform maintained. Heterogeneous and partially calcified plaque at the right carotid bifurcation. No significant lumen shadowing. Low resistance waveform of the right ICA. Significant tortuosity. RIGHT VERTEBRAL ARTERY: Antegrade flow with low resistance waveform. LEFT CAROTID ARTERY: No significant calcifications of the left common carotid artery. Intermediate waveform maintained. Heterogeneous and partially calcified plaque at the left carotid bifurcation without significant lumen shadowing. Low resistance waveform of the left ICA. Significant tortuosity. LEFT VERTEBRAL ARTERY:   Antegrade flow with low resistance waveform. IMPRESSION: Color duplex indicates minimal heterogeneous and calcified plaque, with no hemodynamically significant stenosis by duplex criteria in the extracranial cerebrovascular circulation. Signed, Dulcy Fanny. Dellia Nims, RPVI [facility] Electronically Signed   By: Corrie Mckusick D.O.   On: 10/24/2018 09:55    Assessment: 68 year old male admitted with UGI bleed, acute blood loss anemia on aspirin and Plavix, s/p EGD 9/29 with non-bleeding duodenal bulb ulcer and moderate gastritis. Hgb improved to 9.8 today from 6.3 yesterday, s/p 2 units PRBCs.  No overt GI bleeding. Clinically doing well and appropriate for discharge home from a GI standpoint. No prior colonoscopy, and he would like [facility] to [facility] this with [facility] as [facility]. Will arrange routine hospital follow-up.      Plan: May resume aspirin and Plavix today PPI BID Pathology pending Initial screening colonoscopy as outpatient in elective setting Will arrange outpatient hospital follow-up   Annitta Needs, PhD, ANP-BC [facility]        LOS: 2 days    10/26/2018, 8:09 AM

## 2018-10-26 NOTE — Discharge Summary (Signed)
Physician Discharge Summary  Minus BreedingChat Hickey VZD:[id number]:[id number] DOB: 10/22/1950 DOA: 10/24/2018  PCP: System, Pcp Not In  Admit date: 10/24/2018 Discharge date: 10/26/2018  Time spent: 35 minutes  Recommendations for Outpatient Follow-up:  1. Repeat CBC to follow up Hgb trend 2. Repeat BMET to follow up electrolytes and renal function 3. Reassess BP and adjust antihypertensive regimen as needed 4. Check B12 level in 4-6 weeks and provide further supplementation/repletion as required.   Discharge Diagnoses:  Principal Problem:   Syncope Active Problems:   Hypertension   Diabetes mellitus, new onset (HCC)   Hyperlipidemia   CAD (coronary artery disease), native coronary artery   Black tarry stools   Acute blood loss anemia   Melena   Upper GI bleed   Discharge Condition: stable and improved. Discharge home with instructions to follow up with PCP in 10 days.   Diet recommendation: heart healthy diet and modified carbohydrates diet.  Filed Weights   10/24/18 0336 10/24/18 1300 10/26/18 0500  Weight: 86.5 kg 71.9 kg 72.5 kg    History of present illness:  68 year old male with a history of coronary artery disease/STEMI 2014with DES x2 to the mid LAD, hyperlipidemia, hypertension, diabetes mellitus type 2 presenting with syncope. The patient woke up around 1 AM on 10/24/2018 because he was having leg cramps. He describes some shortness of breath and diaphoresis. He felt like this was similar symptoms as when he had his heart attack in 2014. He took nitroglycerin shortly after which he syncopized. He woke up on the floor and did not recall the events. He denies biting his tongue or urinary or bowel incontinence. Prior to the above episode, the patient had been in his usual state of health. He had denied any previous chest discomfort, shortness of breath, coughing, dizziness, or syncope. There is been no fevers, chills, nausea, vomiting, diarrhea, abdominal pain, dysuria, hematuria.  He endorses compliance with all his medications. He states that he has been able to perform his usual daily activities without any chest discomfort or shortness of breath. In the emergency department, the patient was afebrile hemodynamically stable saturating 100% room air. BMP, LFTs were unremarkable. Hemoglobin was 7.9 which was a significant drop from January 2015 when his hemoglobin was 13.3. FOBT was positive. EKG shows sinus rhythm with nonspecific T wave changes.  Hospital Course:  Syncope: -in the setting of ABLA and use of NTG -neg orthostatic and stable VS at discharge -no telemetry abnormalities and Echo without significant valvular abnormalities; patient with preserved EF. -advise to maintain adequate hydration and [facility]  ABLA/melena -due to upper GI bleed (gastritis and duodenal ulcer) -started on Protonix BID -safe to resume aspirin and Plavix on 9/30 as per GI recommendations. -tolerating diet without any difficulties. -discharge on niferex daily -repeat CBC at follow visit to assess Hgb trend  CAD/history os STEMI and elevated troponin -continue metoprolol, ASA/Plavix and statins  -advise to follow heart healthy diet  B12 deficiency -discharge on oral daily supplementation. -repeat B12 in 4-6 weeks and if needed start IM supplementation.   HLD -continue statins   HTN -overall stable at discharge -will continue use of metoprolol.  Type 2 diabetes -A1C 6.4 -continue diet control   Tobacco abuse -cessation counseling provided.    Procedures: EGD: demonstrated gastritis and duodenum bulb ulcer.  Consultations:  GI  Cardiology service   Discharge Exam: Vitals:   10/26/18 0800 10/26/18 0837  BP: 116/86   Pulse: 70   Resp: Marland Kitchen(!)  27   Temp:    SpO2: 95% 97%    General: afebrile, no CP, no SOB, no further signs of overt bleeding. Patient is tolerating diet and will like to go home.  Cardiovascular: S1 and  S2, no rubs, no gallops. Respiratory: CTA bilaterally; good O2 sat on RA Abd: soft, NT, ND, positive BS Extremities: no edema, no cyanosis   Discharge Instructions   Discharge Instructions    Diet - low sodium heart healthy   Complete by: As directed    Discharge instructions   Complete by: As directed    Take medications as prescribed Arrange follow up with PCP in 10 days Please follow low sodium diet Maintain adequate hydration     Allergies as of 10/26/2018      Reactions   Ticagrelor    Possible cause of shortness of breath      Medication List    STOP taking these medications   amLODipine 10 MG tablet Commonly known as: NORVASC     TAKE these medications   aspirin EC 81 MG tablet Take 81 mg by mouth daily.   atorvastatin 80 MG tablet Commonly known as: LIPITOR Take 80 mg by mouth daily at 6 PM.   clopidogrel 75 MG tablet Commonly known as: PLAVIX Take 75 mg by mouth daily with breakfast.   iron polysaccharides 150 MG capsule Commonly known as: NIFEREX Take 1 capsule (150 mg total) by mouth daily.   metoprolol tartrate 25 MG tablet Commonly known as: LOPRESSOR Take 1 tablet (25 mg total) by mouth 2 (two) times daily.   nitroGLYCERIN 0.4 MG SL tablet Commonly known as: NITROSTAT Place 1 tablet (0.4 mg total) under the tongue every 5 (five) minutes x 3 doses as needed for chest pain.   pantoprazole 40 MG tablet Commonly known as: PROTONIX Take 1 tablet (40 mg total) by mouth 2 (two) times daily before a meal.   vitamin B-12 500 MCG tablet Commonly known as: CYANOCOBALAMIN Take 2 tablets (1,000 mcg total) by mouth daily. Start taking on: October 27, 2018      Allergies  Allergen Reactions  . Ticagrelor     Possible cause of shortness of breath     The results of significant diagnostics from this hospitalization (including imaging, microbiology, ancillary and laboratory) are listed below for reference.    Significant Diagnostic Studies: Ct  Head Wo Contrast  Result Date: 10/24/2018 CLINICAL DATA:  Syncope and fall. EXAM: CT HEAD WITHOUT CONTRAST TECHNIQUE: Contiguous axial images were obtained from the base of the skull through the vertex without intravenous contrast. COMPARISON:  None. FINDINGS: Brain: No evidence of acute infarction, hemorrhage, hydrocephalus, extra-axial collection or mass lesion/mass effect. Remote lacunar infarct at the left caudate head, see coronal reformats. Vascular: No hyperdense vessel or unexpected calcification. Skull: Normal. Negative for fracture or focal lesion. Sinuses/Orbits: No acute finding. IMPRESSION: 1. No acute finding. 2. 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 Electronically Signed   By: Marnee SpringJonathon  Watts M.D.   On: 10/24/2018 09:20   Ct Angio Chest Pe W Or Wo Contrast  Result Date: 10/24/2018 CLINICAL DATA:  PE suspected. Positive D-dimer. Shortness of breath. EXAM: CT ANGIOGRAPHY CHEST WITH CONTRAST TECHNIQUE: Multidetector CT imaging of the chest was performed using the standard protocol during bolus administration of intravenous contrast. Multiplanar CT image reconstructions and MIPs were obtained to evaluate the vascular anatomy. CONTRAST:  100mL OMNIPAQUE IOHEXOL 350 MG/ML SOLN COMPARISON:  None. FINDINGS: Cardiovascular: Evaluation for the detection of pulmonary emboli  is nearly nondiagnostic secondary to a suboptimal contrast bolus timing. There is no convincing large centrally located pulmonary embolus. Detection of smaller pulmonary emboli is not possible on this exam.Heart size is normal. There is no significant pericardial effusion. There is no evidence for an aortic dissection. Atherosclerotic changes are noted of the thoracic aorta and coronary arteries. Mediastinum/Nodes: --No mediastinal or hilar lymphadenopathy. --No axillary lymphadenopathy. --No supraclavicular lymphadenopathy. --Normal thyroid gland. --The esophagus is unremarkable Lungs/Pleura: There are mild emphysematous changes.  The lungs are essentially clear. There is no pneumothorax or pleural effusion. The trachea is unremarkable. Upper Abdomen: No acute abnormality. Musculoskeletal: No chest wall abnormality. No acute or significant osseous findings. Review of the MIP images confirms the above findings. IMPRESSION: Evaluation for the detection of pulmonary emboli is nearly nondiagnostic secondary to a suboptimal contrast bolus timing. There is no convincing large centrally located pulmonary embolus. Detection of smaller pulmonary emboli is not possible on this exam. Aortic Atherosclerosis (ICD10-I70.0) and Emphysema (ICD10-J43.9). Electronically Signed   By: Katherine Mantle M.D.   On: 10/24/2018 12:50   US Carotid Bilateral  Result Date: 10/24/2018 CLINICAL DATA:  68 year old male with a history of syncope EXAM: BILATERAL CAROTID DUPLEX ULTRASOUND TECHNIQUE: Wallace Cullens scale imaging, color Doppler and duplex ultrasound were performed of bilateral carotid and vertebral arteries in the neck. COMPARISON:  None. FINDINGS: Criteria: Quantification of carotid stenosis is based on velocity parameters that correlate the residual internal carotid diameter with NASCET-based stenosis levels, using the diameter of the distal internal carotid lumen as the denominator for stenosis measurement. The following velocity measurements were obtained: RIGHT ICA:  Systolic 121 cm/sec, Diastolic 44 cm/sec CCA:  83 cm/sec SYSTOLIC ICA/CCA RATIO:  1.5 ECA:  118 cm/sec LEFT ICA:  Systolic 78 cm/sec, Diastolic 32 cm/sec CCA:  87 cm/sec SYSTOLIC ICA/CCA RATIO:  0.9 ECA:  80 cm/sec Right Brachial SBP: Not acquired Left Brachial SBP: Not acquired RIGHT CAROTID ARTERY: No significant calcifications of the right common carotid artery. Intermediate waveform maintained. Heterogeneous and partially calcified plaque at the right carotid bifurcation. No significant lumen shadowing. Low resistance waveform of the right ICA. Significant tortuosity. RIGHT VERTEBRAL ARTERY:  Antegrade flow with low resistance waveform. LEFT CAROTID ARTERY: No significant calcifications of the left common carotid artery. Intermediate waveform maintained. Heterogeneous and partially calcified plaque at the left carotid bifurcation without significant lumen shadowing. Low resistance waveform of the left ICA. Significant tortuosity. LEFT VERTEBRAL ARTERY:  Antegrade flow with low resistance waveform. IMPRESSION: Color duplex indicates minimal heterogeneous and calcified plaque, with no hemodynamically significant stenosis by duplex criteria in the extracranial cerebrovascular circulation. Signed, Yvone Neu. Reyne Dumas, RPVI [facility] Radiology Electronically Signed   By: Gilmer Mor D.O.   On: 10/24/2018 09:55   Dg Chest Port 1 View  Result Date: 10/24/2018 CLINICAL DATA:  Shortness of breath EXAM: PORTABLE CHEST 1 VIEW COMPARISON:  12/29/2012 FINDINGS: Normal heart size and mediastinal contours. There is mild wasting of the trachea at the thoracic inlet that is unchanged, question goiter. No acute infiltrate or edema. No effusion or pneumothorax. No acute osseous findings. IMPRESSION: 1. No evidence of acute disease. 2. Mild tapering of the trachea at the thoracic inlet, stable from 2014. Question goiter-please correlate with neck exam. Electronically Signed   By: Marnee Spring M.D.   On: 10/24/2018 04:40    Microbiology: Recent Results (from the past 240 hour(s))  SARS Coronavirus 2 [facility] order, Performed in [facility] hospital lab) Nasopharyngeal Nasopharyngeal Swab  Status: None   Collection Time: 10/24/18  8:17 AM   Specimen: Nasopharyngeal Swab  Result Value Ref Range Status   SARS Coronavirus 2 NEGATIVE NEGATIVE Final    Comment: (NOTE) If result is NEGATIVE SARS-CoV-2 target nucleic acids are NOT DETECTED. The SARS-CoV-2 RNA is generally detectable in upper and lower  respiratory specimens during the acute phase of  infection. The lowest  concentration of SARS-CoV-2 viral copies this assay can detect is 250  copies / mL. A negative result does not preclude SARS-CoV-2 infection  and should not be used as the sole basis for treatment or other  patient management decisions.  A negative result may occur with  improper specimen collection / handling, submission of specimen other  than nasopharyngeal swab, presence of viral mutation(s) within the  areas targeted by this assay, and inadequate number of viral copies  (<250 copies / mL). A negative result must be combined with clinical  observations, patient history, and epidemiological information. If result is POSITIVE SARS-CoV-2 target nucleic acids are DETECTED. The SARS-CoV-2 RNA is generally detectable in upper and lower  respiratory specimens dur ing the acute phase of infection.  Positive  results are indicative of active infection with SARS-CoV-2.  Clinical  correlation with patient history and other diagnostic information is  necessary to determine patient infection status.  Positive results do  not rule out bacterial infection or co-infection with other viruses. If result is PRESUMPTIVE POSTIVE SARS-CoV-2 nucleic acids MAY BE PRESENT.   A presumptive positive result was obtained on the submitted specimen  and confirmed on repeat testing.  While 2019 novel coronavirus  (SARS-CoV-2) nucleic acids may be present in the submitted sample  additional confirmatory testing may be necessary for epidemiological  and / or clinical management purposes  to differentiate between  SARS-CoV-2 and other Sarbecovirus currently known to infect humans.  If clinically indicated additional testing with an alternate test  methodology [phone]) is advised. The SARS-CoV-2 RNA is generally  detectable in upper and lower respiratory sp ecimens during the acute  phase of infection. The expected result is Negative. Fact Sheet for Patients:   StrictlyIdeas.no Fact Sheet for Healthcare Providers: BankingDealers.co.za This test is not yet approved or cleared by the [place] FDA and has been authorized for detection and/or diagnosis of SARS-CoV-2 by FDA under an Emergency Use Authorization (EUA).  This EUA will remain in effect (meaning this test can be used) for the duration of the COVID-19 declaration under Section 564(b)(1) of the Act, 21 U.S.C. section 360bbb-3(b)(1), unless the authorization is terminated or revoked sooner. Performed at [facility], [address], [place], [place] [address]   MRSA PCR Screening     Status: None   Collection Time: 10/24/18 12:40 PM   Specimen: Nasal Mucosa; Nasopharyngeal  Result Value Ref Range Status   MRSA by PCR NEGATIVE NEGATIVE Final    Comment:        The GeneXpert MRSA Assay (FDA approved for NASAL specimens only), is one component of a comprehensive MRSA colonization surveillance program. It is not intended to diagnose MRSA infection nor to guide or monitor treatment for MRSA infections. Performed at [facility], [address], [place], [place] [address]      Labs: Basic Metabolic Panel: Recent Labs  Lab 10/24/18 0351 10/25/18 0422 10/26/18 0424  NA 139 139 139  K 4.2 3.9 3.9  CL 106 106 103  CO2 24 23 26   GLUCOSE 163* 111* 107*  BUN 60* 25* 16  CREATININE  1.07 0.90 0.98  CALCIUM 8.0* 8.4* 8.7*   Liver Function Tests: Recent Labs  Lab 10/24/18 0351 10/25/18 0422  AST 21 25  ALT 24 18  ALKPHOS 60 52  BILITOT 0.5 0.7  PROT 6.1* 5.7*  ALBUMIN 3.3* 3.3*   CBC: Recent Labs  Lab 10/24/18 0351 10/24/18 1211 10/24/18 1254 10/25/18 0422 10/26/18 0424  WBC 11.8* 10.1  --  8.2 7.1  NEUTROABS 9.2*  --   --   --   --   HGB 7.9* 7.5*  --  6.3* 9.8*  HCT 24.7* 24.5* 20.3* 20.7* 30.6*  MCV 96.1 98.4  --  95.8 93.0  PLT 264 262  --  210 180   BNP (last 3 results) Recent Labs    10/24/18 0351  BNP  27.0    CBG: Recent Labs  Lab 10/25/18 1142 10/25/18 1427 10/25/18 1659 10/25/18 1948 10/26/18 0757  GLUCAP 111* 110* 109* 173* 121*    Signed:  Vassie Loll MD.  [facility] 10/26/2018, 9:27 AM

## 2018-10-26 NOTE — Telephone Encounter (Signed)
Edward Fleming, please arrange outpatient hospital follow-up for 2 months. History of GI bleed due to duodenal ulcer.

## 2018-10-27 ENCOUNTER — Other Ambulatory Visit: Payer: Self-pay

## 2018-10-27 ENCOUNTER — Encounter: Payer: Self-pay | Admitting: Gastroenterology

## 2018-10-27 LAB — SURGICAL PATHOLOGY

## 2018-10-27 NOTE — Telephone Encounter (Signed)
PATIENT SCHEDULED AND LETTER SENT  °

## 2018-10-31 ENCOUNTER — Telehealth: Payer: Self-pay | Admitting: Gastroenterology

## 2018-10-31 NOTE — Telephone Encounter (Signed)
Pt is aware of results and plan. [facility] , [facility]. He will call if he needs anything.

## 2018-10-31 NOTE — Telephone Encounter (Signed)
Please call pt. His stomach Bx shows gastritis DUE TO ETOH AND THE ULCER WAS DUE TO ASA.  CONTINUE ASA/PLAVIX. CONTINUE PROTONIX. TAKE 30 MINUTES PRIOR TO MEALS TWICE DAILY FOR ONE MONTH THEN ONCE DAILY FOREVER. OUTPATIENT VISIT IN 4 WEEKS IN 4 MOS WITH DR. Gala Romney OR AB.

## 2018-11-01 NOTE — Telephone Encounter (Signed)
OV made °

## 2018-11-01 NOTE — Telephone Encounter (Signed)
FYI

## 2019-01-03 ENCOUNTER — Ambulatory Visit: Payer: Medicare HMO | Admitting: Gastroenterology

## 2020-03-09 IMAGING — US US CAROTID DUPLEX BILAT
1 series · 13 of 24 positions shown · non-contrast
Comparison: None.

CLINICAL DATA: 67-year-old male with a history of syncope

EXAM:
BILATERAL CAROTID DUPLEX ULTRASOUND
TECHNIQUE: Gray scale imaging, color Doppler and duplex ultrasound were
performed of bilateral carotid and vertebral arteries in the neck.

[Series 1: us carotid duplex bilat · 0.06mm/px · 13 of 69 slices shown]
[im 1/69]
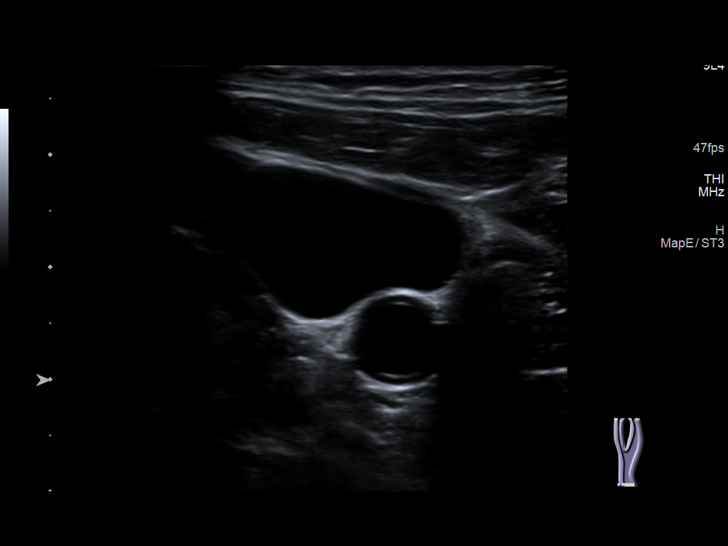
[im 6/69]
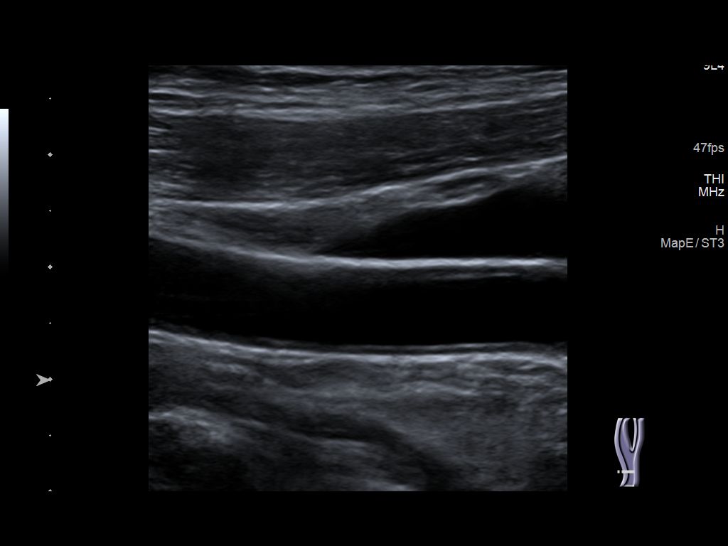
[im 12/69]
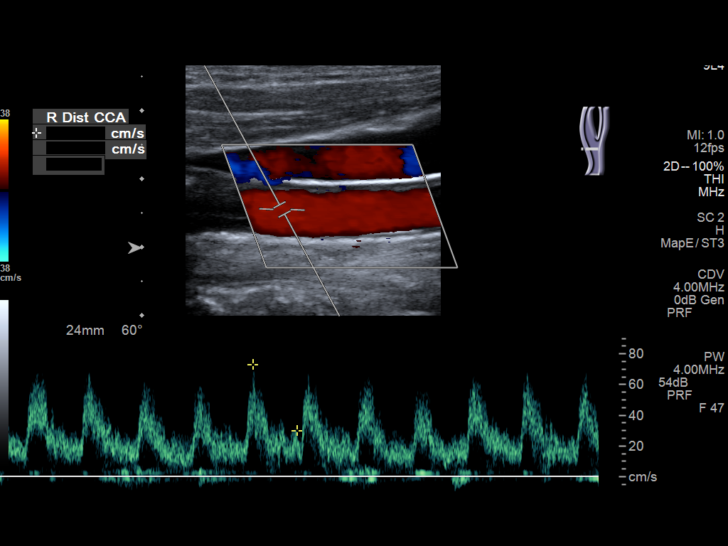
[im 18/69]
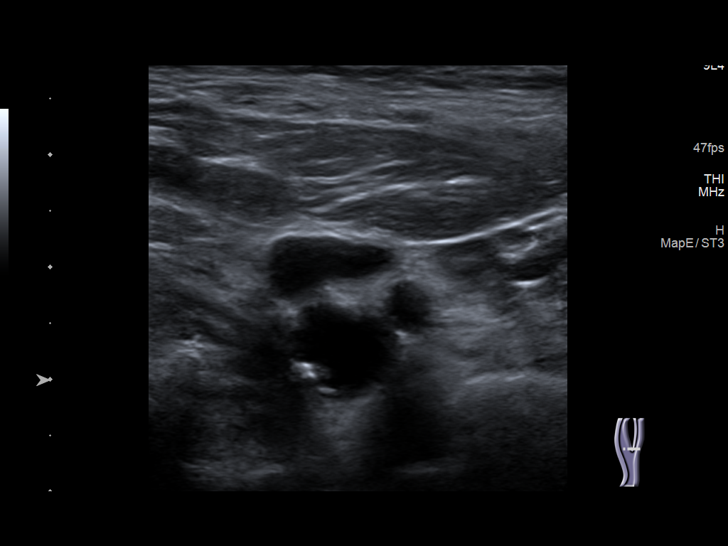
[im 24/69]
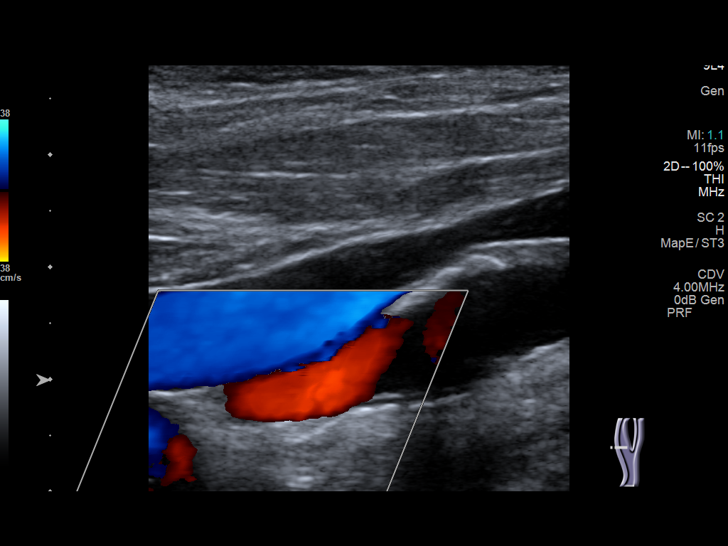
[im 30/69]
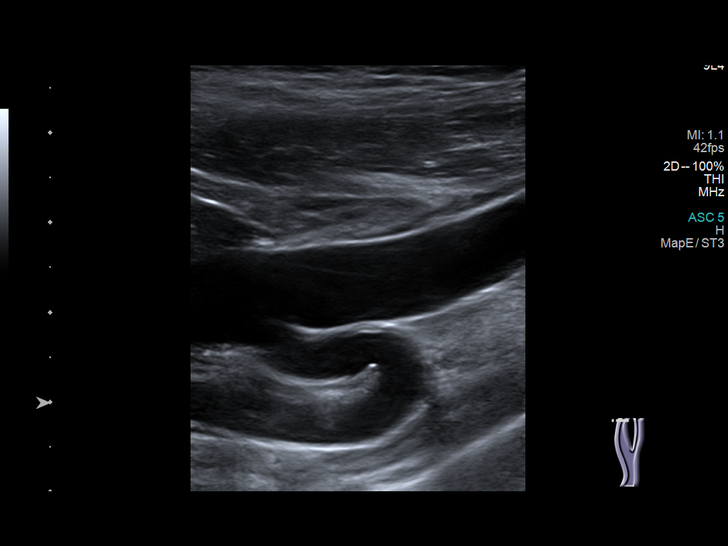
[im 36/69]
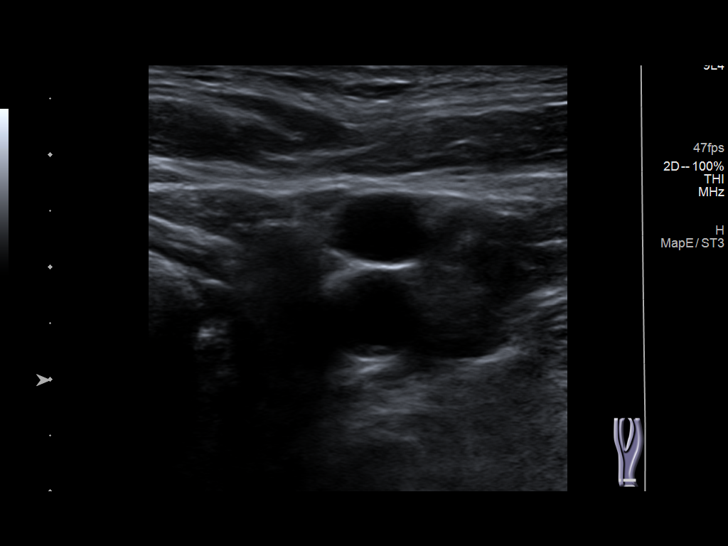
[im 39/69]
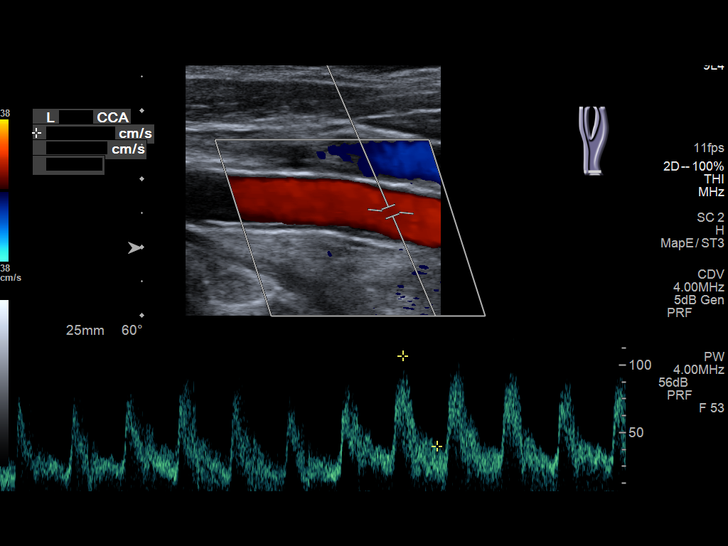
[im 45/69]
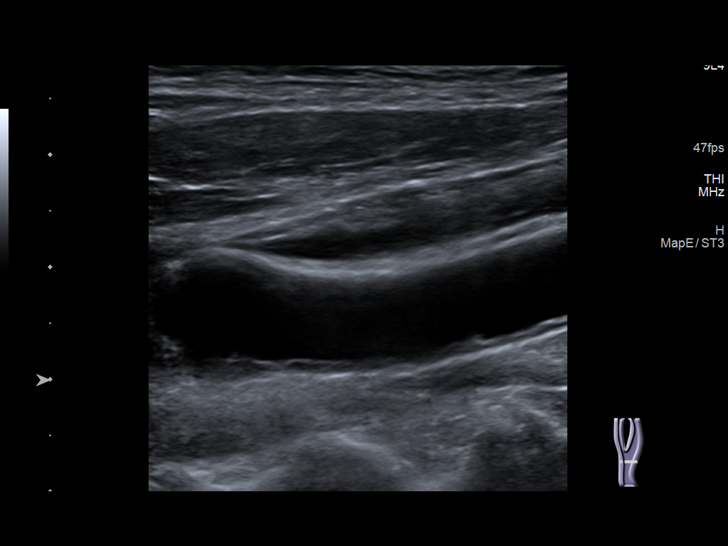
[im 51/69]
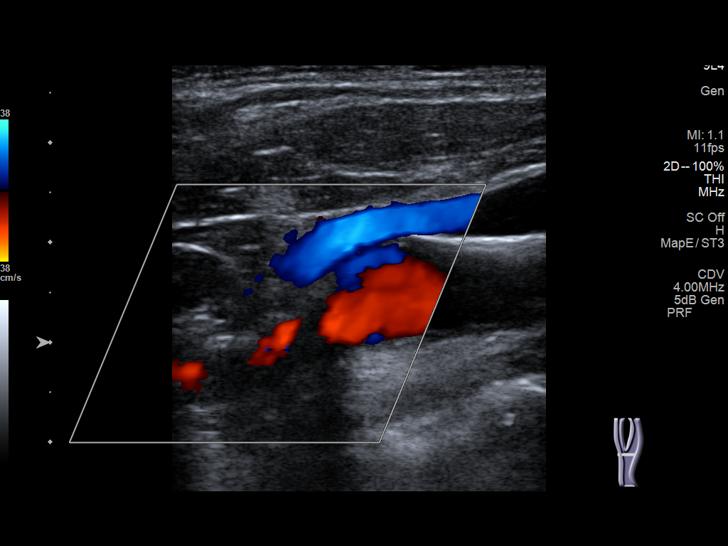
[im 57/69]
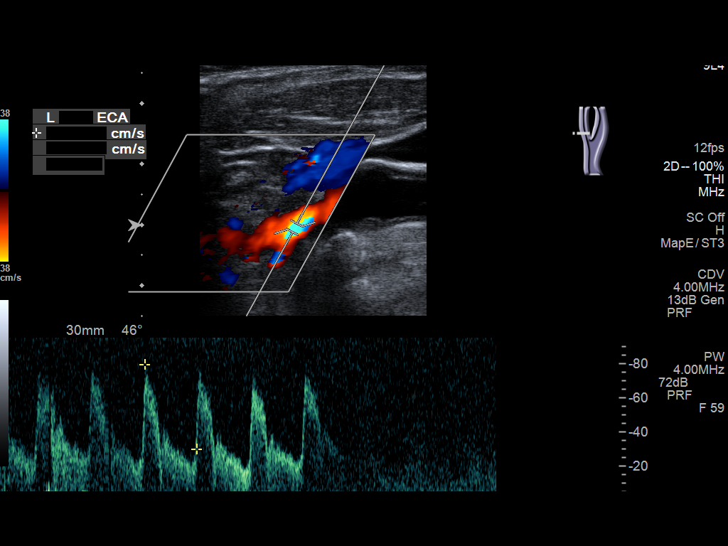
[im 63/69]
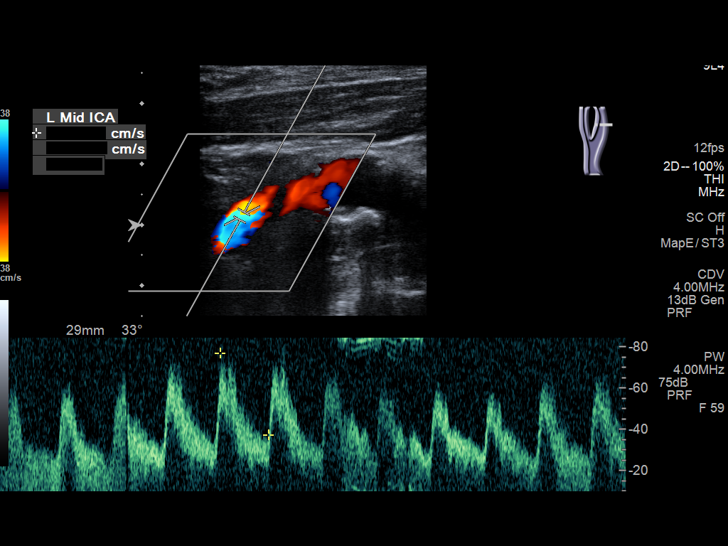
[im 69/69]
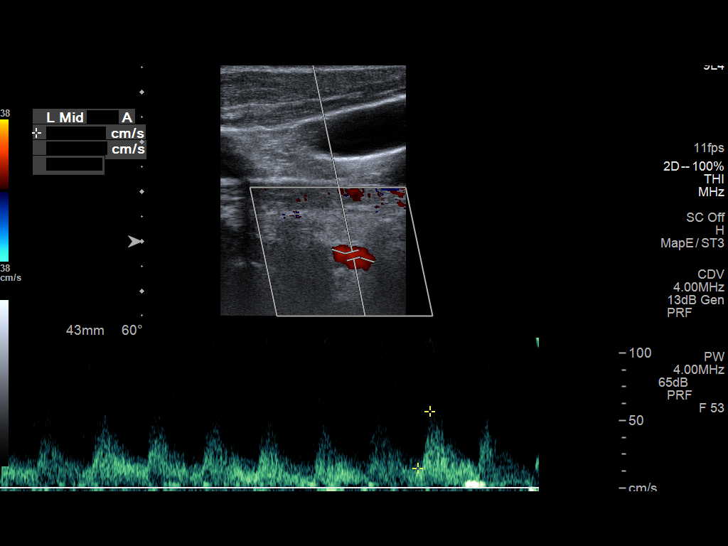

[13 of 24 positions shown; findings below may reference images not displayed]

FINDINGS: Criteria: Quantification of carotid stenosis is based on velocity
parameters that correlate the residual internal carotid diameter
with NASCET-based stenosis levels, using the diameter of the distal
internal carotid lumen as the denominator for stenosis measurement.

The following velocity measurements were obtained:

RIGHT

ICA:  Systolic 121 cm/sec, Diastolic 44 cm/sec

CCA:  83 cm/sec

SYSTOLIC ICA/CCA RATIO:

ECA:  118 cm/sec

LEFT

ICA:  Systolic 78 cm/sec, Diastolic 32 cm/sec

CCA:  87 cm/sec

SYSTOLIC ICA/CCA RATIO:

ECA:  80 cm/sec

Right Brachial SBP: Not acquired

Left Brachial SBP: Not acquired

RIGHT CAROTID ARTERY: No significant calcifications of the right
common carotid artery. Intermediate waveform maintained.
Heterogeneous and partially calcified plaque at the right carotid
bifurcation. No significant lumen shadowing. Low resistance waveform
of the right ICA. Significant tortuosity.

RIGHT VERTEBRAL ARTERY: Antegrade flow with low resistance waveform.

LEFT CAROTID ARTERY: No significant calcifications of the left
common carotid artery. Intermediate waveform maintained.
Heterogeneous and partially calcified plaque at the left carotid
bifurcation without significant lumen shadowing. Low resistance
waveform of the left ICA. Significant tortuosity.

LEFT VERTEBRAL ARTERY:  Antegrade flow with low resistance waveform.
IMPRESSION: Color duplex indicates minimal heterogeneous and calcified plaque,
with no hemodynamically significant stenosis by duplex criteria in
the extracranial cerebrovascular circulation.

## 2020-03-09 IMAGING — CT CT ANGIO CHEST
2 of 6 series · 18 of 46 positions shown · IV contrast (omnipaque)
Comparison: None.

CLINICAL DATA: PE suspected. Positive D-dimer. Shortness of breath.

EXAM:
CT ANGIOGRAPHY CHEST WITH CONTRAST
TECHNIQUE: Multidetector CT imaging of the chest was performed using the
standard protocol during bolus administration of intravenous
contrast. Multiplanar CT image reconstructions and MIPs were
obtained to evaluate the vascular anatomy.
CONTRAST:  100mL OMNIPAQUE IOHEXOL 350 MG/ML SOLN

[Series 5: pe axial thins · axial · 0.82mm/px · z∈[-100,+161]mm · 15 of 287 slices shown]
[im 13/287  lung]
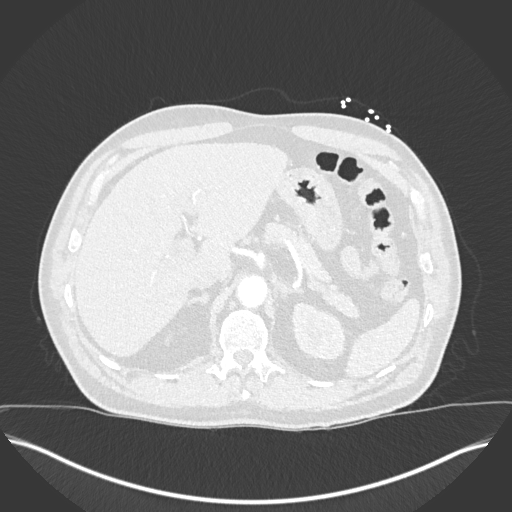
[im 38/287  soft-tissue]
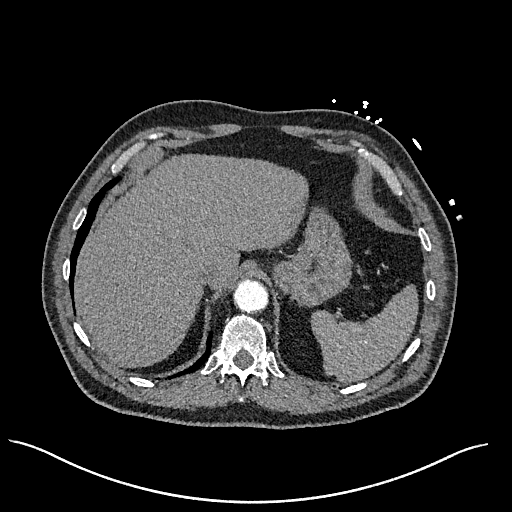
[im 50/287  lung]
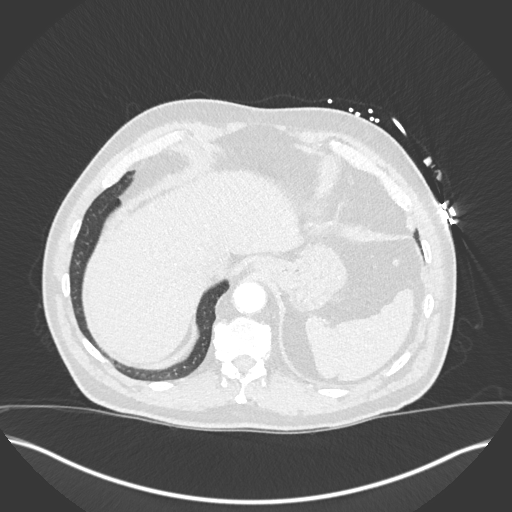
[im 75/287  soft-tissue]
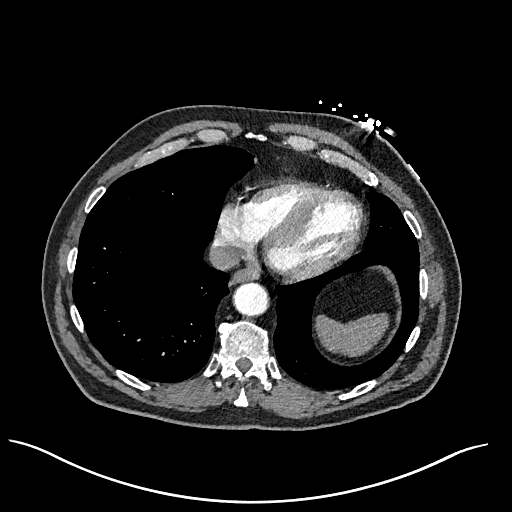
[im 88/287  lung]
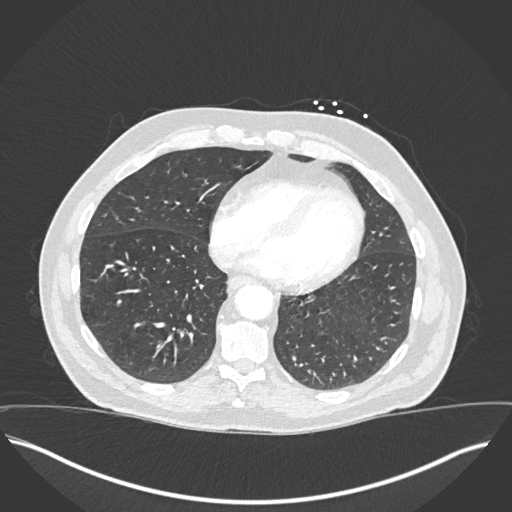
[im 112/287  soft-tissue]
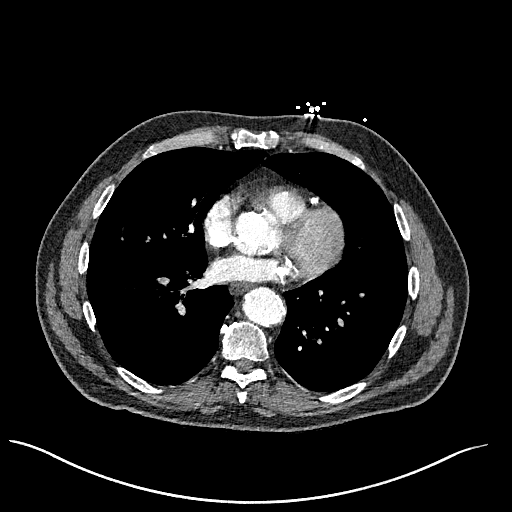
[im 125/287  lung]
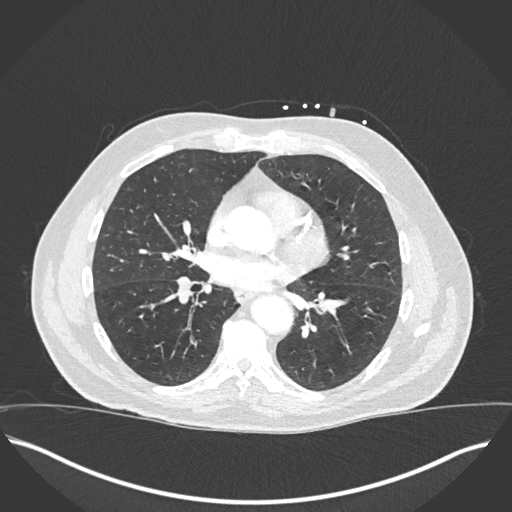
[im 150/287  soft-tissue]
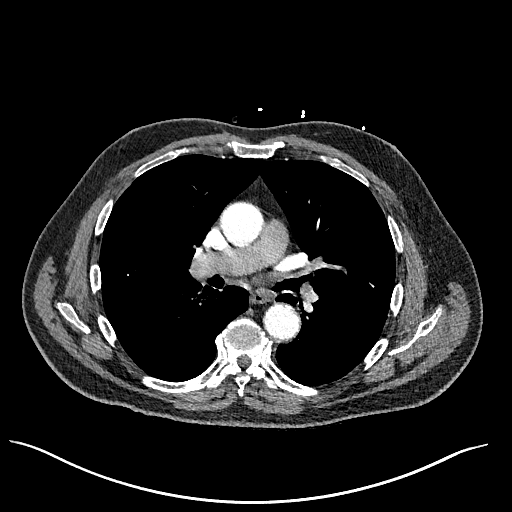
[im 162/287  lung]
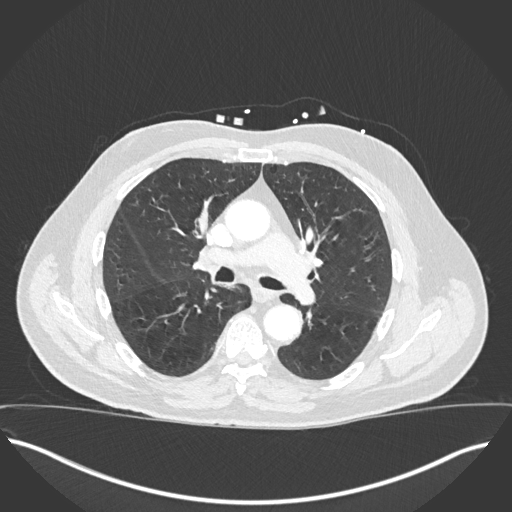
[im 175/287  soft-tissue]
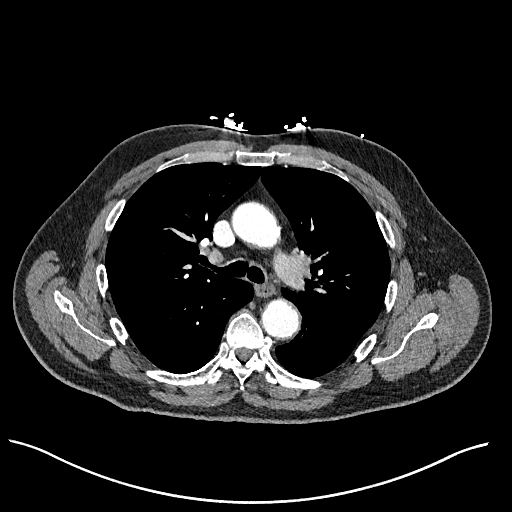
[im 199/287  lung]
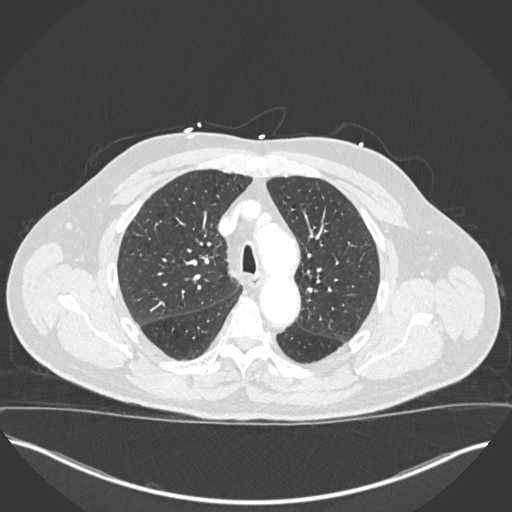
[im 212/287  soft-tissue]
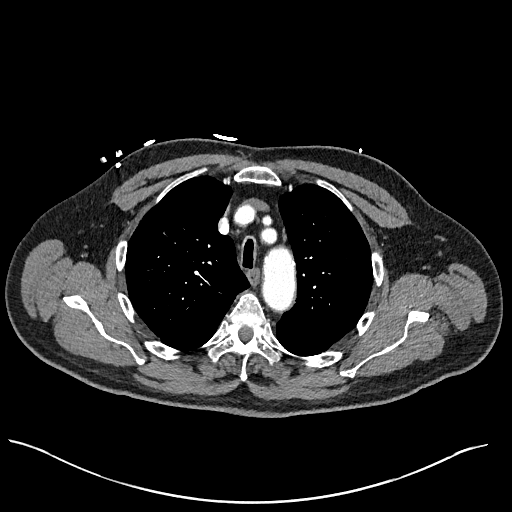
[im 237/287  lung]
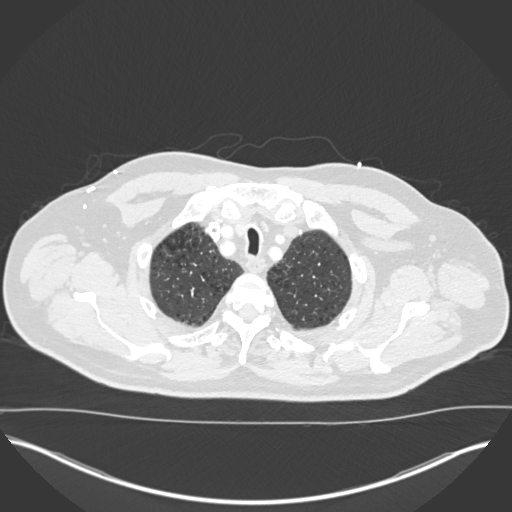
[im 249/287  soft-tissue]
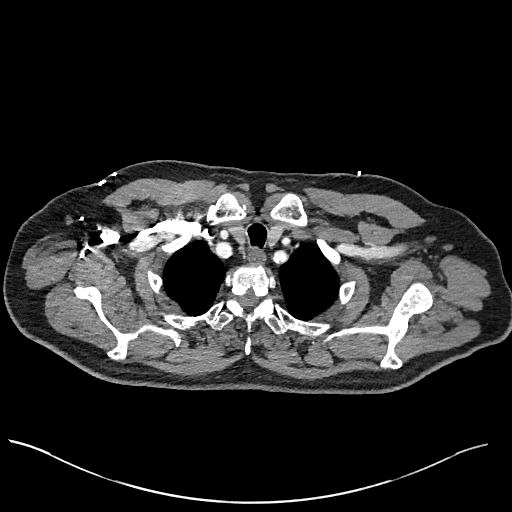
[im 274/287  lung]
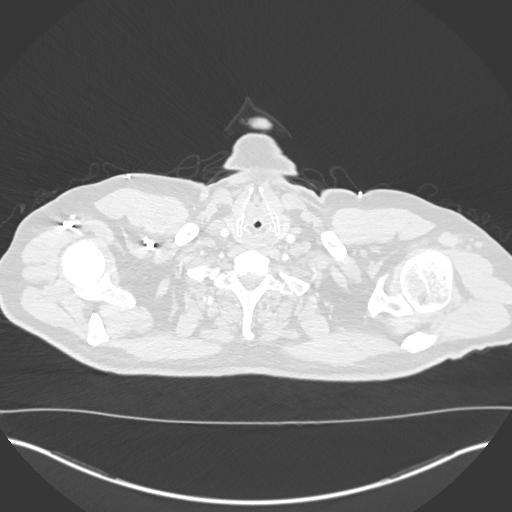

[Series 7: cor soft · coronal · 0.60mm/px · 3 of 151 slices shown]
[im 38/151  soft-tissue]
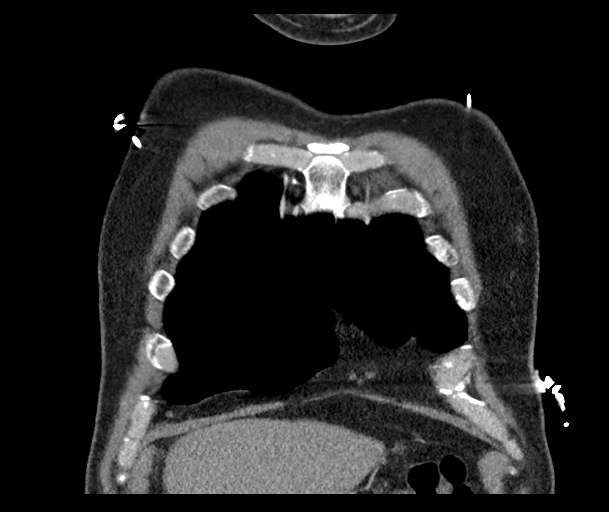
[im 76/151  soft-tissue]
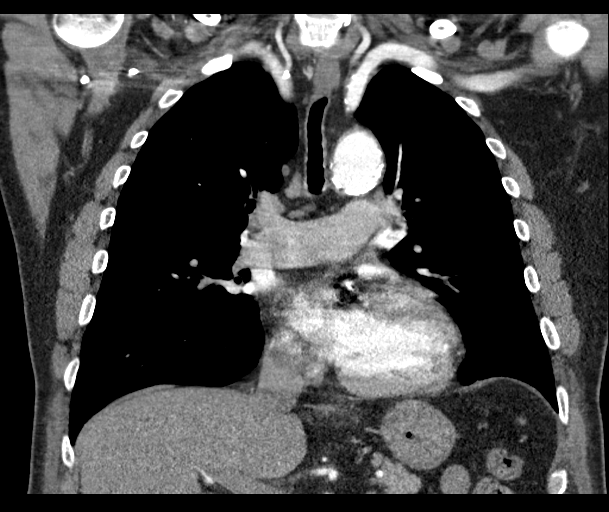
[im 113/151  soft-tissue]
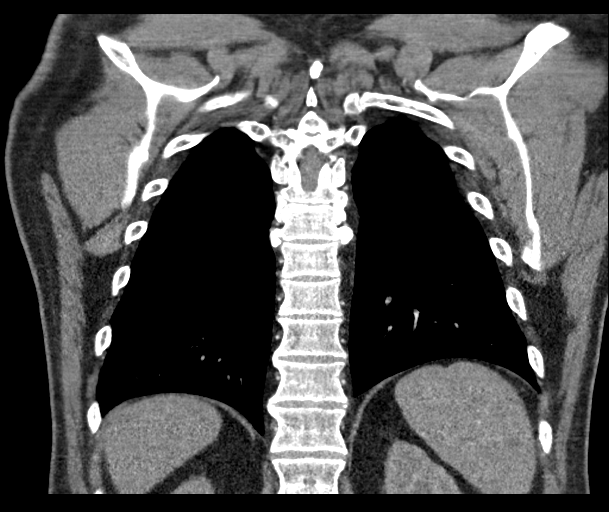

[18 of 46 positions shown; findings below may reference images not displayed]

FINDINGS: Cardiovascular: Evaluation for the detection of pulmonary emboli is
nearly nondiagnostic secondary to a suboptimal contrast bolus
timing. There is no convincing large centrally located pulmonary
embolus. Detection of smaller pulmonary emboli is not possible on
this exam.Heart size is normal. There is no significant pericardial
effusion. There is no evidence for an aortic dissection.
Atherosclerotic changes are noted of the thoracic aorta and coronary
arteries.

Mediastinum/Nodes:

--No mediastinal or hilar lymphadenopathy.

--No axillary lymphadenopathy.

--No supraclavicular lymphadenopathy.

--Normal thyroid gland.

--The esophagus is unremarkable

Lungs/Pleura: There are mild emphysematous changes. The lungs are
essentially clear. There is no pneumothorax or pleural effusion. The
trachea is unremarkable.

Upper Abdomen: No acute abnormality.

Musculoskeletal: No chest wall abnormality. No acute or significant
osseous findings.

Review of the MIP images confirms the above findings.
IMPRESSION: Evaluation for the detection of pulmonary emboli is nearly
nondiagnostic secondary to a suboptimal contrast bolus timing. There
is no convincing large centrally located pulmonary embolus.
Detection of smaller pulmonary emboli is not possible on this exam.

Aortic Atherosclerosis (8O8R8-DTZ.Z) and Emphysema ([id number].4).

## 2020-03-09 IMAGING — CT CT HEAD W/O CM
3 series · 16 of 47 positions shown, 19 images · non-contrast
Comparison: None.

CLINICAL DATA: Syncope and fall.

EXAM:
CT HEAD WITHOUT CONTRAST
TECHNIQUE: Contiguous axial images were obtained from the base of the skull
through the vertex without intravenous contrast.

[Series 2: head w o · axial · 0.43mm/px · z∈[+241,+366]mm · 10 of 31 slices shown, 13 images]
[im 3/31  brain]
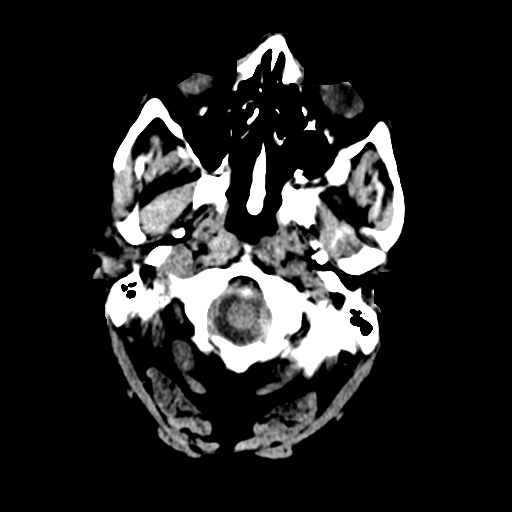
[im 3/31  bone]
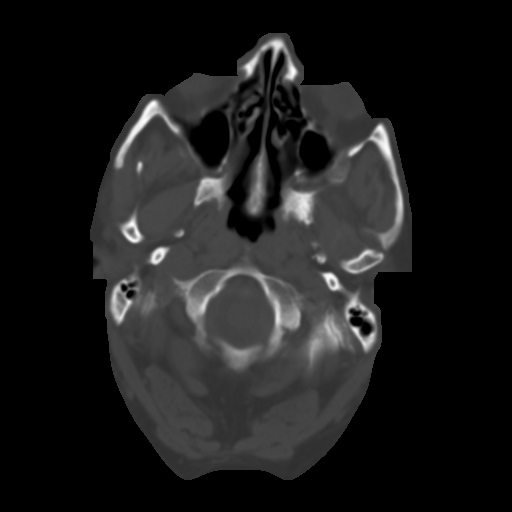
[im 6/31  brain]
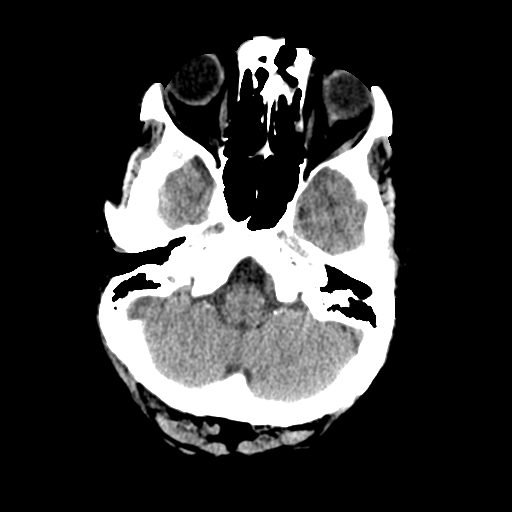
[im 9/31  brain]
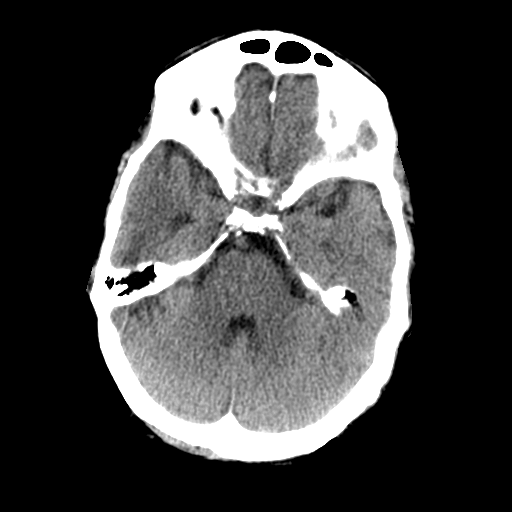
[im 11/31  brain]
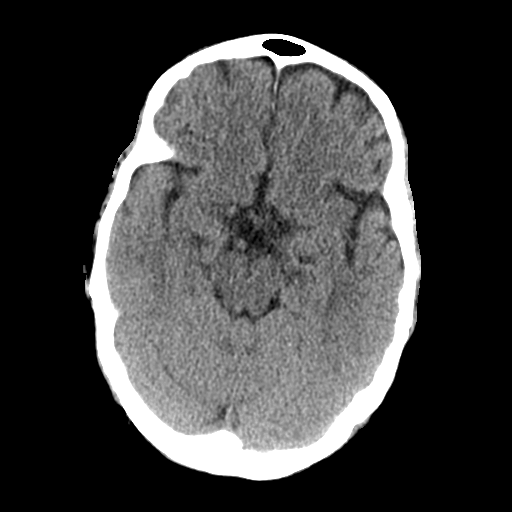
[im 14/31  brain]
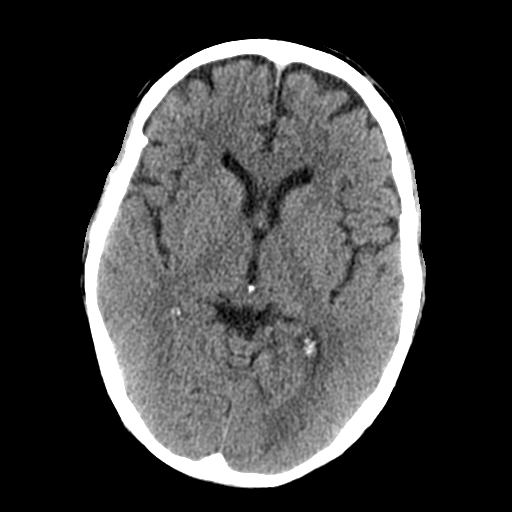
[im 14/31  bone]
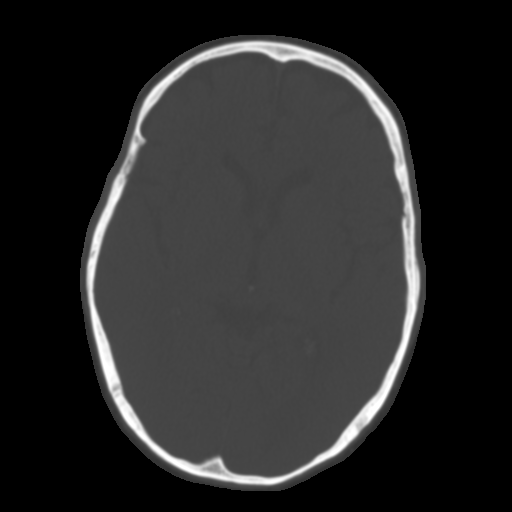
[im 17/31  brain]
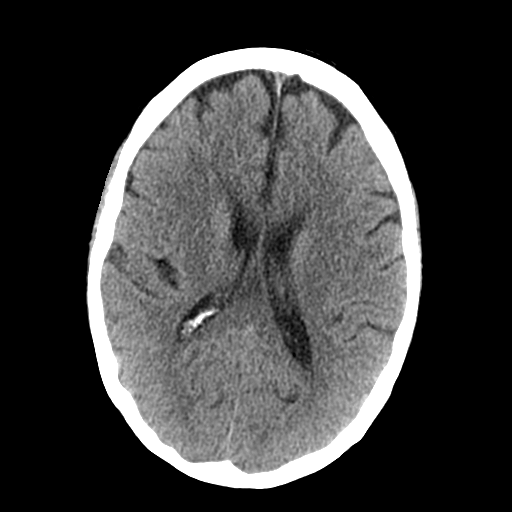
[im 20/31  brain]
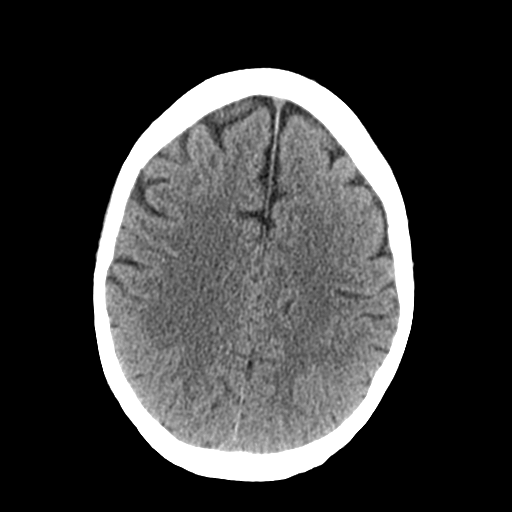
[im 23/31  brain]
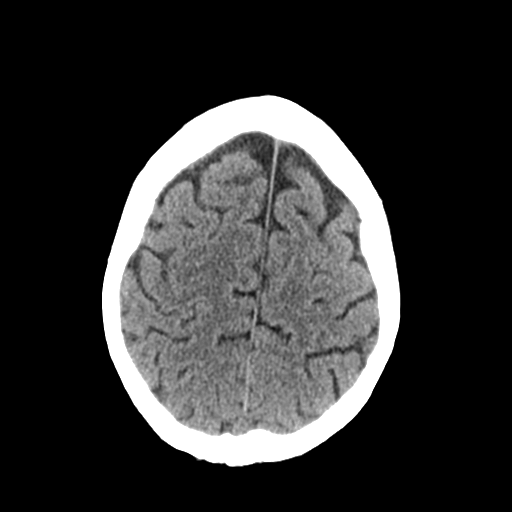
[im 25/31  brain]
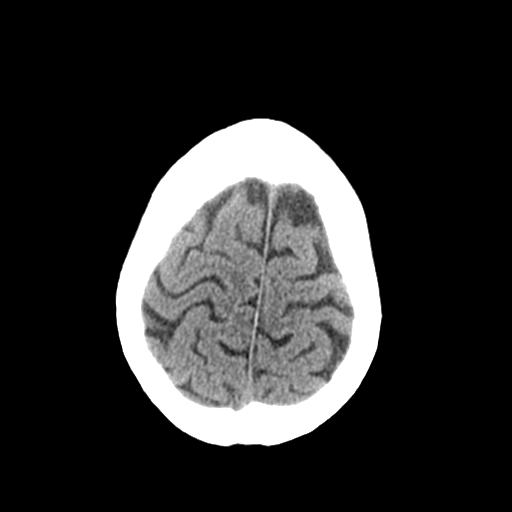
[im 25/31  bone]
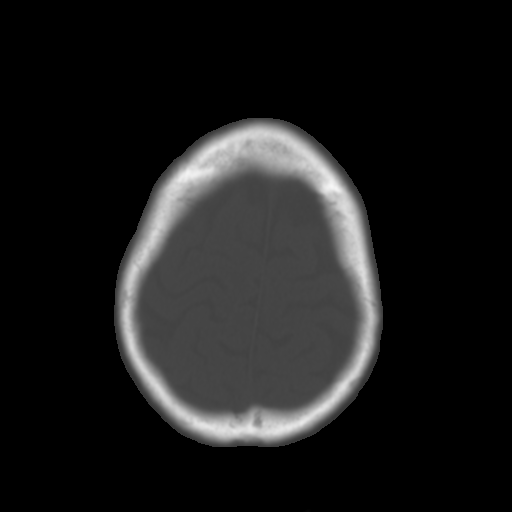
[im 28/31  brain]
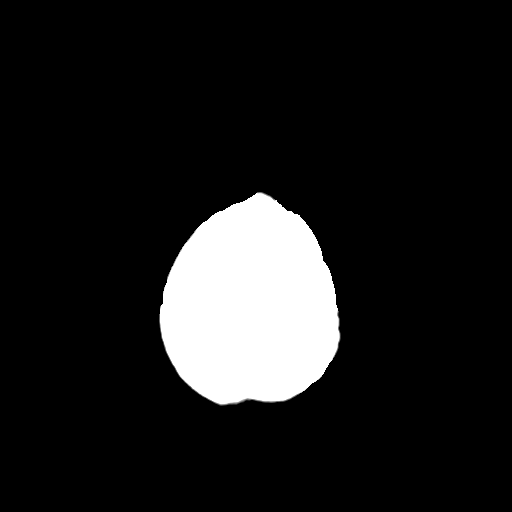

[Series 4: coronal soft · coronal · 0.32mm/px · 3 of 79 slices shown]
[im 27/79  brain]
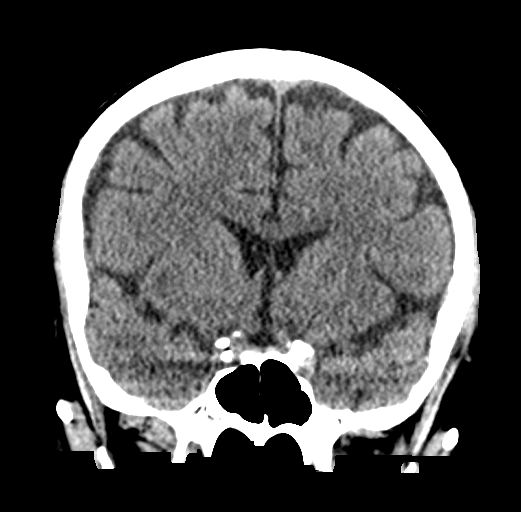
[im 35/79  brain]
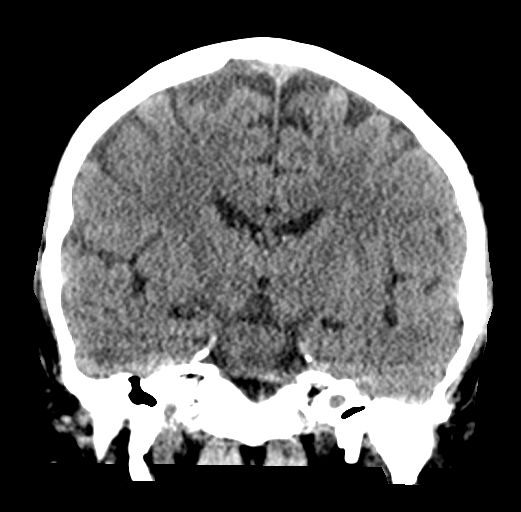
[im 44/79  brain]
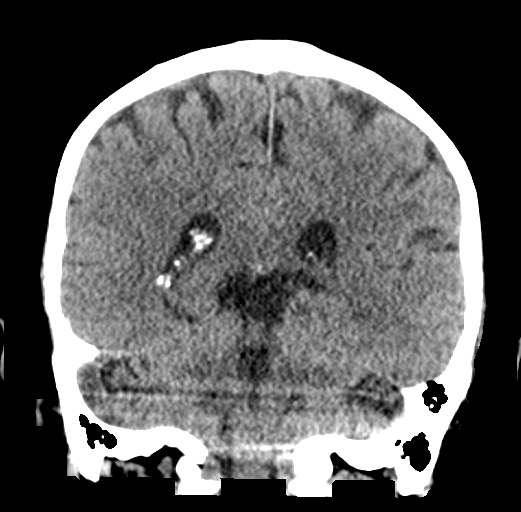

[Series 5: sagittal soft · sagittal · 0.35mm/px · 3 of 68 slices shown]
[im 23/68  brain]
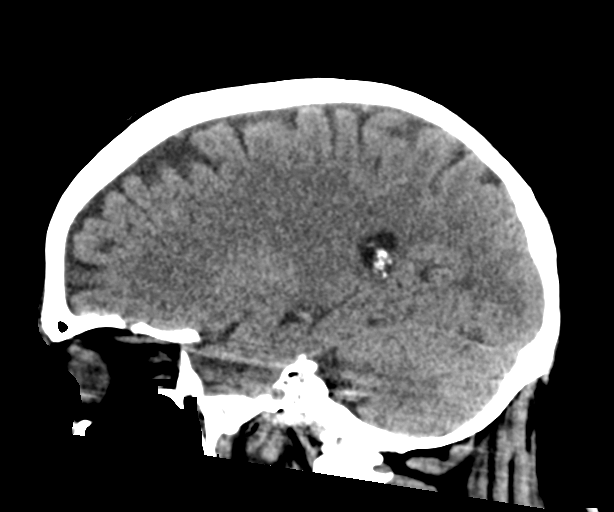
[im 34/68  brain]
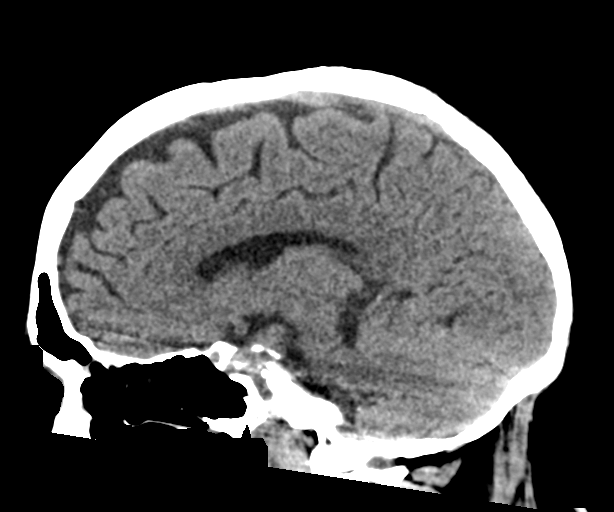
[im 45/68  brain]
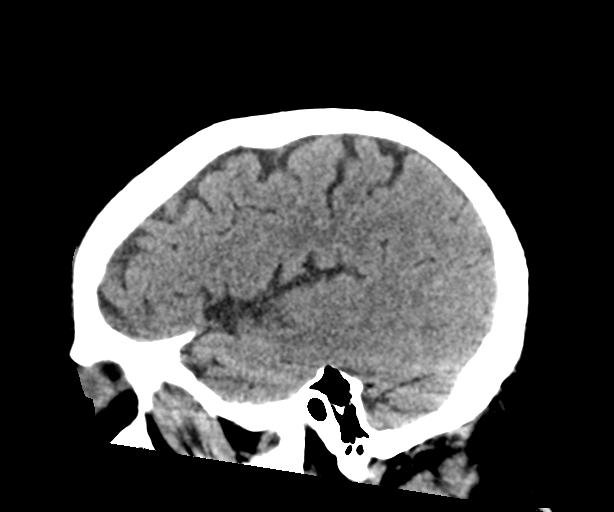

[16 of 47 positions shown; findings below may reference images not displayed]

FINDINGS: Brain: No evidence of acute infarction, hemorrhage, hydrocephalus,
extra-axial collection or mass lesion/mass effect. Remote lacunar
infarct at the left caudate head, see coronal reformats.

Vascular: No hyperdense vessel or unexpected calcification.

Skull: Normal. Negative for fracture or focal lesion.

Sinuses/Orbits: No acute finding.
IMPRESSION: 1. No acute finding.
2. Remote lacunar infarct at the left caudate.
